# Patient Record
Sex: Female | Born: 1955 | Race: White | Hispanic: No | Marital: Married | Smoking: Never smoker
Health system: Southern US, Community
[De-identification: ages and names within clinical notes are randomized; demographics above are authoritative.]

## PROBLEM LIST (undated history)

## (undated) DIAGNOSIS — Z789 Other specified health status: Secondary | ICD-10-CM

## (undated) DIAGNOSIS — E119 Type 2 diabetes mellitus without complications: Secondary | ICD-10-CM

## (undated) DIAGNOSIS — K219 Gastro-esophageal reflux disease without esophagitis: Secondary | ICD-10-CM

## (undated) DIAGNOSIS — Z8719 Personal history of other diseases of the digestive system: Secondary | ICD-10-CM

## (undated) DIAGNOSIS — K649 Unspecified hemorrhoids: Secondary | ICD-10-CM

## (undated) DIAGNOSIS — M1990 Unspecified osteoarthritis, unspecified site: Secondary | ICD-10-CM

## (undated) HISTORY — PX: TONSILLECTOMY: SUR1361

---

## 2003-08-01 ENCOUNTER — Ambulatory Visit (HOSPITAL_COMMUNITY): Admission: RE | Admit: 2003-08-01 | Discharge: 2003-08-01 | Payer: Self-pay | Admitting: Orthopedic Surgery

## 2004-03-12 ENCOUNTER — Encounter: Admission: RE | Admit: 2004-03-12 | Discharge: 2004-06-10 | Payer: Self-pay | Admitting: *Deleted

## 2004-05-07 ENCOUNTER — Encounter: Admission: RE | Admit: 2004-05-07 | Discharge: 2004-05-07 | Payer: Self-pay | Admitting: *Deleted

## 2004-05-20 ENCOUNTER — Observation Stay (HOSPITAL_COMMUNITY): Admission: RE | Admit: 2004-05-20 | Discharge: 2004-05-21 | Payer: Self-pay | Admitting: *Deleted

## 2004-05-23 ENCOUNTER — Inpatient Hospital Stay (HOSPITAL_COMMUNITY): Admission: AD | Admit: 2004-05-23 | Discharge: 2004-05-26 | Payer: Self-pay | Admitting: Surgery

## 2004-05-23 ENCOUNTER — Encounter: Admission: RE | Admit: 2004-05-23 | Discharge: 2004-05-23 | Payer: Self-pay | Admitting: *Deleted

## 2004-07-15 ENCOUNTER — Encounter: Admission: RE | Admit: 2004-07-15 | Discharge: 2004-10-13 | Payer: Self-pay | Admitting: *Deleted

## 2005-12-28 ENCOUNTER — Emergency Department (HOSPITAL_COMMUNITY): Admission: EM | Admit: 2005-12-28 | Discharge: 2005-12-28 | Payer: Self-pay | Admitting: Emergency Medicine

## 2005-12-29 ENCOUNTER — Emergency Department (HOSPITAL_COMMUNITY): Admission: EM | Admit: 2005-12-29 | Discharge: 2005-12-29 | Payer: Self-pay | Admitting: Emergency Medicine

## 2008-06-14 ENCOUNTER — Encounter: Admission: RE | Admit: 2008-06-14 | Discharge: 2008-06-14 | Payer: Self-pay | Admitting: Nurse Practitioner

## 2009-07-25 ENCOUNTER — Ambulatory Visit (HOSPITAL_COMMUNITY): Admission: RE | Admit: 2009-07-25 | Discharge: 2009-07-25 | Payer: Self-pay | Admitting: General Surgery

## 2009-08-27 ENCOUNTER — Ambulatory Visit (HOSPITAL_COMMUNITY): Admission: RE | Admit: 2009-08-27 | Discharge: 2009-08-28 | Payer: Self-pay | Admitting: General Surgery

## 2010-08-31 LAB — DIFFERENTIAL
Basophils Absolute: 0 10*3/uL (ref 0.0–0.1)
Basophils Absolute: 0.1 10*3/uL (ref 0.0–0.1)
Basophils Relative: 0 % (ref 0–1)
Basophils Relative: 1 % (ref 0–1)
Eosinophils Absolute: 0 10*3/uL (ref 0.0–0.7)
Eosinophils Absolute: 0.2 10*3/uL (ref 0.0–0.7)
Eosinophils Relative: 0 % (ref 0–5)
Eosinophils Relative: 3 % (ref 0–5)
Lymphocytes Relative: 15 % (ref 12–46)
Lymphocytes Relative: 25 % (ref 12–46)
Lymphs Abs: 1.5 10*3/uL (ref 0.7–4.0)
Lymphs Abs: 2 10*3/uL (ref 0.7–4.0)
Monocytes Absolute: 0.4 10*3/uL (ref 0.1–1.0)
Monocytes Absolute: 1 10*3/uL (ref 0.1–1.0)
Monocytes Relative: 10 % (ref 3–12)
Monocytes Relative: 6 % (ref 3–12)
Neutro Abs: 5.1 10*3/uL (ref 1.7–7.7)
Neutro Abs: 7.5 10*3/uL (ref 1.7–7.7)
Neutrophils Relative %: 66 % (ref 43–77)
Neutrophils Relative %: 75 % (ref 43–77)

## 2010-08-31 LAB — GLUCOSE, CAPILLARY
Glucose-Capillary: 107 mg/dL — ABNORMAL HIGH (ref 70–99)
Glucose-Capillary: 149 mg/dL — ABNORMAL HIGH (ref 70–99)
Glucose-Capillary: 176 mg/dL — ABNORMAL HIGH (ref 70–99)
Glucose-Capillary: 188 mg/dL — ABNORMAL HIGH (ref 70–99)
Glucose-Capillary: 200 mg/dL — ABNORMAL HIGH (ref 70–99)
Glucose-Capillary: 231 mg/dL — ABNORMAL HIGH (ref 70–99)
Glucose-Capillary: 92 mg/dL (ref 70–99)

## 2010-08-31 LAB — COMPREHENSIVE METABOLIC PANEL
ALT: 17 U/L (ref 0–35)
AST: 17 U/L (ref 0–37)
Albumin: 4 g/dL (ref 3.5–5.2)
Alkaline Phosphatase: 71 U/L (ref 39–117)
BUN: 6 mg/dL (ref 6–23)
CO2: 30 mEq/L (ref 19–32)
Calcium: 9.6 mg/dL (ref 8.4–10.5)
Chloride: 106 mEq/L (ref 96–112)
Creatinine, Ser: 0.78 mg/dL (ref 0.4–1.2)
GFR calc Af Amer: >60 mL/min (ref >60)
GFR calc non Af Amer: >60 mL/min (ref >60)
Glucose, Bld: 160 mg/dL — ABNORMAL HIGH (ref 70–99)
Potassium: 3.9 mEq/L (ref 3.5–5.1)
Sodium: 142 mEq/L (ref 135–145)
Total Bilirubin: 0.6 mg/dL (ref 0.3–1.2)
Total Protein: 7.4 g/dL (ref 6.0–8.3)

## 2010-08-31 LAB — CBC
HCT: 35.3 % — ABNORMAL LOW (ref 36.0–46.0)
HCT: 40.9 % (ref 36.0–46.0)
Hemoglobin: 11.9 g/dL — ABNORMAL LOW (ref 12.0–15.0)
Hemoglobin: 13.8 g/dL (ref 12.0–15.0)
MCHC: 33.7 g/dL (ref 30.0–36.0)
MCHC: 33.8 g/dL (ref 30.0–36.0)
MCV: 86.9 fL (ref 78.0–100.0)
MCV: 87.2 fL (ref 78.0–100.0)
Platelets: 255 10*3/uL (ref 150–400)
Platelets: 279 10*3/uL (ref 150–400)
RBC: 4.06 MIL/uL (ref 3.87–5.11)
RBC: 4.69 MIL/uL (ref 3.87–5.11)
RDW: 12.5 % (ref 11.5–15.5)
RDW: 12.5 % (ref 11.5–15.5)
WBC: 10 10*3/uL (ref 4.0–10.5)
WBC: 7.8 10*3/uL (ref 4.0–10.5)

## 2010-08-31 LAB — URINALYSIS, ROUTINE W REFLEX MICROSCOPIC
Bilirubin Urine: NEGATIVE
Glucose, UA: NEGATIVE mg/dL
Hgb urine dipstick: NEGATIVE
Ketones, ur: NEGATIVE mg/dL
Nitrite: NEGATIVE
Protein, ur: NEGATIVE mg/dL
Specific Gravity, Urine: 1.016 (ref 1.005–1.030)
Urobilinogen, UA: 0.2 mg/dL (ref 0.0–1.0)
pH: 7.5 (ref 5.0–8.0)

## 2010-10-24 NOTE — Op Note (Signed)
NAMECECILEY, Cassandra Ortega                 ACCOUNT NO.:  [id number]   MEDICAL RECORD NO.:  [id number]          PATIENT TYPE:  INP   LOCATION:  0457                         FACILITY:  [facility]   PHYSICIAN:  Vikki Ports, MDDATE OF BIRTH:  1955/09/25   DATE OF PROCEDURE:  05/21/2004  DATE OF DISCHARGE:                                 OPERATIVE REPORT   PREOPERATIVE DIAGNOSIS:  Morbid obesity.   POSTOPERATIVE DIAGNOSIS:  Morbid obesity.   PROCEDURE:  Laparoscopic adjustable gastric banding.   SURGEON:  Vikki Ports, MD   ASSISTANT:  Thornton Park. Daphine Deutscher, MD   ANESTHESIA:  General.   DESCRIPTION OF PROCEDURE:  Patient was taken to the operating room and  placed in the supine position.  After adequate general anesthesia was  induced using a endotracheal tube, the abdomen was prepped and draped in the  normal sterile fashion.  Using an 11 mm trocar in the Optiview technique,  peritoneal access was obtained.  Pneumoperitoneum was obtained.  Under  direct visualization, an additional 12 mm trocar was placed in the right mid  abdomen, an 11 mm trocar was placed through the falciform ligament.  A 5 mm  trocar was placed in the right abdomen.  A Nathanson retractor was placed  through a 5 mm trocar site in the upper abdomen.  The left lateral segment  was retracted.  The angle of His was dissected using Bovie electrocautery  and blunt dissection.  Then opened the lesser omentum.  Identified an area  on the right crus where the fat was crossing and passed the lap band passer  behind the esophagus and stomach and brought it out at the previously  dissected portion at the angle of His.  The band was in place through the 12  mm port using the [place] lap band passer and was passed behind the esophagus.  At this point, the dilating tube and balloon were placed intraorally by  anesthesia, and the band was closed around the upper part of the stomach.  The band moved easily, and the tube was  removed.  The band was fixed in  place with interrupted 2-0 Ethibond sutures approximating seromuscular bites  from the anterior surface of the stomach to the proximal stomach and  esophagus.  The tubing was then brought out through the 12 mm port site and  the right abdomen.  Pneumoperitoneum was released.  Nathanson retractor was  removed.  The port was fixed to the anterior abdominal fascia using  interrupted Prolene sutures.  The skin over that site was closed with a  subcutaneous 3-0 Vicryl and a subcuticular 4-0 Monocryl.  Steri-Strips and  sterile dressings were applied.  Other incisions were closed with staples.  Patient tolerated the procedure well and went to the PACU in good condition.     Gaylyn Rong   KRH/MEDQ  D:  05/21/2004  T:  05/21/2004  Job:  [id number]

## 2010-10-24 NOTE — H&P (Signed)
NAMELAYLA, KESLING                 ACCOUNT NO.:  [id number]   MEDICAL RECORD NO.:  [id number]          PATIENT TYPE:  INP   LOCATION:  0483                         FACILITY[facility]   PHYSICIAN:  Vikki Ports, MDDATE OF BIRTH:  10-20-1955   DATE OF ADMISSION:  05/23/2004  DATE OF DISCHARGE:  05/26/2004                                HISTORY & PHYSICAL   ADMISSION DIAGNOSIS:  Morbid obesity.   HISTORY OF PRESENT ILLNESS:  Patient is a 55 year old female with a long-  term history of morbid obesity.  She has undergone extensive work-up for  laparoscopic adjustable gastric banding.  She presents now for this  procedure.   PAST MEDICAL HISTORY:  Osteoarthritis.   Dictation stopped at this point.      KRH/MEDQ  D:  06/26/2004  T:  06/26/2004  Job:  [id number]

## 2010-10-24 NOTE — Discharge Summary (Signed)
[name], Cassandra Ortega                 ACCOUNT NO.:  [id number]   MEDICAL RECORD NO.:  [id number]          PATIENT TYPE:  INP   LOCATION:  0483                         FACILITY:  [facility]   PHYSICIAN:  Vikki Ports, MDDATE OF BIRTH:  01/10/1956   DATE OF ADMISSION:  05/23/2004  DATE OF DISCHARGE:  05/26/2004                                 DISCHARGE SUMMARY   ADMISSION DIAGNOSIS:  Morbid obesity.   DISCHARGE DIAGNOSIS:  Morbid obesity.   PROCEDURE:  Laparoscopic adjustable gastric banding.   SURGEON:  Vikki Ports, MD   HOSPITAL COURSE:  The patient was admitted and underwent laparoscopic  adjustable gastric banding.  On the evening of surgery, the patient  developed substernal chest pain.  On her Gastrografin swallow, it showed no  drainage around the band; however, by hospital day #2, she was feeling  better.  We gave her liquids, which she tolerated very well and cleared  quickly, chest pain resolved and she was ready for discharge home on  postoperative day #2, tolerating liquids.   FOLLOWUP:  Followup is with me in 1 week.   DIET:  Clear liquids.   PAIN MEDICINE:  Roxicet elixir.   CONDITION ON DISCHARGE:  Stable.   DISPOSITION:  Discharged to home.      KRH/MEDQ  D:  07/15/2004  T:  07/16/2004  Job:  [id number]

## 2011-03-05 ENCOUNTER — Telehealth (INDEPENDENT_AMBULATORY_CARE_PROVIDER_SITE_OTHER): Payer: Self-pay | Admitting: General Surgery

## 2011-03-05 NOTE — Telephone Encounter (Signed)
03/05/11 Recall letter mailed to patient for bariatric surgery follow-up.Marland KitchenMarland Kitchencef

## 2011-04-10 ENCOUNTER — Ambulatory Visit (INDEPENDENT_AMBULATORY_CARE_PROVIDER_SITE_OTHER): Payer: Self-pay | Admitting: General Surgery

## 2011-11-10 ENCOUNTER — Other Ambulatory Visit: Payer: Self-pay | Admitting: Obstetrics and Gynecology

## 2011-11-10 DIAGNOSIS — R928 Other abnormal and inconclusive findings on diagnostic imaging of breast: Secondary | ICD-10-CM

## 2011-11-17 ENCOUNTER — Ambulatory Visit
Admission: RE | Admit: 2011-11-17 | Discharge: 2011-11-17 | Disposition: A | Discharge status: Home / Self Care | Payer: BC Managed Care – PPO | Source: Ambulatory Visit | Attending: Obstetrics and Gynecology | Admitting: Obstetrics and Gynecology

## 2011-11-17 DIAGNOSIS — R928 Other abnormal and inconclusive findings on diagnostic imaging of breast: Secondary | ICD-10-CM

## 2012-02-26 ENCOUNTER — Telehealth (INDEPENDENT_AMBULATORY_CARE_PROVIDER_SITE_OTHER): Payer: Self-pay | Admitting: General Surgery

## 2012-02-26 NOTE — Telephone Encounter (Signed)
Left message on voicemail stating the patient needs to call [facility] at [phone] to schedule a follow-up appointment....cef °

## 2012-03-09 ENCOUNTER — Other Ambulatory Visit: Payer: Self-pay | Admitting: Orthopedic Surgery

## 2012-03-09 MED ORDER — DEXAMETHASONE SODIUM PHOSPHATE 10 MG/ML IJ SOLN: 10.0000 mg | Freq: Once | INTRAMUSCULAR | Status: DC

## 2012-03-09 NOTE — Progress Notes (Signed)
Preoperative surgical orders have been place into the Epic hospital system for Cassandra Ortega on 03/09/2012, 4:55 PM  by Patrica Duel for surgery on 03/30/12.  Preop Bilateral Total Knee orders including Epidural per Anesthesia, IV Tylenol, and IV Decadron as long as there are no contraindications to the above medications. Avel Peace, PA-C

## 2012-03-17 ENCOUNTER — Encounter (HOSPITAL_COMMUNITY): Payer: Self-pay | Admitting: Pharmacy Technician

## 2012-03-23 NOTE — Patient Instructions (Addendum)
20 Cassandra Ortega  03/23/2012   Your procedure is scheduled on:  10-23 -2013  Report to [facility] at     1130   AM.  Call this number if you have problems the morning of surgery: [phone]  Or Presurgical Testing [phone](Trula Frede)   Remember:   Do not eat food:After Midnight.  May have clear liquids:up to 6 Hours before arrival. Nothing after : 1000 AM  Clear liquids include soda, tea, black coffee, apple or grape juice, broth.  Take these medicines the morning of surgery with A SIP OF WATER: Wellbutrin. Tylenol.   Do not wear jewelry, make-up or nail polish.  Do not wear lotions, powders, or perfumes. You may wear deodorant.  Do not shave 48 hours prior to surgery.(face and neck okay, no shaving of legs)  Do not bring valuables to the hospital.  Contacts, dentures or bridgework may not be worn into surgery.  Leave suitcase in the car. After surgery it may be brought to your room.  For patients admitted to the hospital, checkout time is 11:00 AM the day of discharge.   Patients discharged the day of surgery will not be allowed to drive home. Must have responsible person with you x 24 hours once discharged.  Name and phone number of your driver:   Special Instructions: CHG Shower Use Special Wash: see special instruction sheet.(avoid face and genitals)   Please read over the following fact sheets that you were given: MRSA Information, Blood Transfusion fact sheet, Incentive Spirometry Instruction.

## 2012-03-24 ENCOUNTER — Encounter (HOSPITAL_COMMUNITY)
Admission: RE | Admit: 2012-03-24 | Discharge: 2012-03-24 | Disposition: A | Discharge status: Home / Self Care | Payer: BC Managed Care – PPO | Source: Ambulatory Visit | Attending: Orthopedic Surgery | Admitting: Orthopedic Surgery

## 2012-03-24 ENCOUNTER — Encounter (HOSPITAL_COMMUNITY): Payer: Self-pay

## 2012-03-24 DIAGNOSIS — Z789 Other specified health status: Secondary | ICD-10-CM

## 2012-03-24 DIAGNOSIS — K219 Gastro-esophageal reflux disease without esophagitis: Secondary | ICD-10-CM

## 2012-03-24 DIAGNOSIS — M1990 Unspecified osteoarthritis, unspecified site: Secondary | ICD-10-CM

## 2012-03-24 DIAGNOSIS — E119 Type 2 diabetes mellitus without complications: Secondary | ICD-10-CM

## 2012-03-24 DIAGNOSIS — K649 Unspecified hemorrhoids: Secondary | ICD-10-CM

## 2012-03-24 HISTORY — DX: Unspecified osteoarthritis, unspecified site: M19.90

## 2012-03-24 HISTORY — PX: HERNIA REPAIR: SHX51

## 2012-03-24 HISTORY — DX: Other specified health status: Z78.9

## 2012-03-24 HISTORY — DX: Unspecified hemorrhoids: K64.9

## 2012-03-24 HISTORY — PX: LAPAROSCOPIC GASTRIC BANDING: SHX1100

## 2012-03-24 HISTORY — DX: Gastro-esophageal reflux disease without esophagitis: K21.9

## 2012-03-24 HISTORY — PX: LASIK: SHX215

## 2012-03-24 HISTORY — DX: Personal history of other diseases of the digestive system: Z87.19

## 2012-03-24 HISTORY — DX: Type 2 diabetes mellitus without complications: E11.9

## 2012-03-24 LAB — APTT: aPTT: 31 seconds (ref 24–37)

## 2012-03-24 LAB — URINALYSIS, ROUTINE W REFLEX MICROSCOPIC
Bilirubin Urine: NEGATIVE
Glucose, UA: 100 mg/dL — AB
Hgb urine dipstick: NEGATIVE
Specific Gravity, Urine: 1.026 (ref 1.005–1.030)
Urobilinogen, UA: 1 mg/dL (ref 0.0–1.0)

## 2012-03-24 LAB — CBC
HCT: 39.6 % (ref 36.0–46.0)
Hemoglobin: 14.1 g/dL (ref 12.0–15.0)
RDW: 12.3 % (ref 11.5–15.5)
WBC: 12.4 10*3/uL — ABNORMAL HIGH (ref 4.0–10.5)

## 2012-03-24 LAB — PROTIME-INR: Prothrombin Time: 13.1 seconds (ref 11.6–15.2)

## 2012-03-24 LAB — COMPREHENSIVE METABOLIC PANEL
ALT: 14 U/L (ref 0–35)
Albumin: 3.9 g/dL (ref 3.5–5.2)
Alkaline Phosphatase: 84 U/L (ref 39–117)
BUN: 9 mg/dL (ref 6–23)
Chloride: 96 mEq/L (ref 96–112)
Glucose, Bld: 245 mg/dL — ABNORMAL HIGH (ref 70–99)
Potassium: 4 mEq/L (ref 3.5–5.1)
Total Bilirubin: 0.6 mg/dL (ref 0.3–1.2)

## 2012-03-24 NOTE — Progress Notes (Signed)
Lab result viewable in Epic. Note pt is NIDDM, not fasting. Will check CBG on arrival.

## 2012-03-24 NOTE — Pre-Procedure Instructions (Signed)
03-24-12 EKG done.

## 2012-03-29 ENCOUNTER — Other Ambulatory Visit: Payer: Self-pay | Admitting: Orthopedic Surgery

## 2012-03-29 NOTE — H&P (Signed)
Cassandra Ortega  DOB: 07-31-1955 Married / Language: English / Race: White Female  Date of Admission:  03/30/2012  Chief Complaint:  Bilateral Knee Pain  History of Present Illness The patient is a 56 year old female who comes in for a preoperative History and Physical. The patient is scheduled for a bilateral total knee arthroplasty to be performed by Dr. Gus Rankin. Aluisio, MD at [facility] on 03/30/12. The patient is being followed for their bilateral knee pain and osteoarthritis. The patient has not gotten any relief of their symptoms with viscosupplementation. The knees swell and fell hard. She said she had to use ice for several days to get the swelling down. The knees are getting progressively worse. The Visco supplements did help a little bit. She did have a worse feeling after the last shot, but has gotten better. She is at a stage where she is ready to proceed surgery. They have been treated conservatively in the past for the above stated problem and despite conservative measures, they continue to have progressive pain and severe functional limitations and dysfunction. They have failed non-operative management including home exercise, medications, and injections. It is felt that they would benefit from undergoing bilateral total joint replacement. Risks and benefits of the procedure have been discussed with the patient and they elect to proceed with surgery. There are no active contraindications to surgery such as ongoing infection or rapidly progressive neurological disease.   Problem List Osteoarthritis, Knee (715.96)   Allergies Dyes. IVP dye   Family History Hypertension. father Diabetes Mellitus. father   Social History Drug/Alcohol Rehab (Currently). no Exercise. does other Drug/Alcohol Rehab (Previously). no Alcohol use. current drinker; drinks wine; only occasionally per week Current work status. working full time Tobacco use. never  smoker Marital status. married Pain Contract. no Tobacco / smoke exposure. no Children. 2 Number of flights of stairs before winded. greater than 5 Illicit drug use. no Living situation. live with spouse   Medication History MetFORMIN HCl (500MG  Tablet, Oral) Active. BuPROPion HCl ER (SR) (150MG  Tablet ER 12HR, Oral) Active. Vagifem ( Tablet, Vaginal) Active.   Past Surgical History Cesarean Delivery. 1 time Tonsillectomy Lap Band Procedure. Date: 05/2005.   Medical History Diabetes Mellitus, Type II Hiatal Hernia Hemorrhoids Menopause   Review of Systems General:Not Present- Chills, Fever, Night Sweats, Fatigue, Weight Gain, Weight Loss and Memory Loss. Skin:Not Present- Hives, Itching, Rash, Eczema and Lesions. HEENT:Not Present- Tinnitus, Headache, Double Vision, Visual Loss, Hearing Loss and Dentures. Respiratory:Not Present- Shortness of breath with exertion, Shortness of breath at rest, Allergies, Coughing up blood and Chronic Cough. Cardiovascular:Not Present- Chest Pain, Racing/skipping heartbeats, Difficulty Breathing Lying Down, Murmur, Swelling and Palpitations. Gastrointestinal:Not Present- Bloody Stool, Heartburn, Abdominal Pain, Vomiting, Nausea, Constipation, Diarrhea, Difficulty Swallowing, Jaundice and Loss of appetitie. Female Genitourinary:Not Present- Blood in Urine, Urinary frequency, Weak urinary stream, Discharge, Flank Pain, Incontinence, Painful Urination, Urgency, Urinary Retention and Urinating at Night. Musculoskeletal:Present- Muscle Weakness, Muscle Pain, Joint Swelling and Joint Pain. Not Present- Back Pain, Morning Stiffness and Spasms. Neurological:Not Present- Tremor, Dizziness, Blackout spells, Paralysis, Difficulty with balance and Weakness. Psychiatric:Not Present- Insomnia.   Vitals Weight: 163 lb Height: 60 in Body Surface Area: 1.77 m Body Mass Index: 31.83 kg/m Pulse: 84 (Regular) Resp.: 14  (Unlabored) BP: 132/78 (Sitting, Right Arm, Standard)   Physical Exam The physical exam findings are as follows:  Patient is a 56 year old female with bilateral knee pain.  General Mental Status - Alert, cooperative and good  historian. General Appearance- pleasant. Not in acute distress. Orientation- Oriented X3. Build & Nutrition- Well nourished and Well developed.   Head and Neck Head- normocephalic, atraumatic . Neck Global Assessment- supple. no bruit auscultated on the right and no bruit auscultated on the left.   Eye Pupil- Bilateral- Regular and Round. Motion- Bilateral- EOMI.   ENMT upper and lower dentures  Chest and Lung Exam Auscultation: Breath sounds:- clear at anterior chest wall and - clear at posterior chest wall. Adventitious sounds:- No Adventitious sounds.   Cardiovascular Auscultation:Rhythm- Regular rate and rhythm. Heart Sounds- S1 WNL and S2 WNL. Murmurs & Other Heart Sounds:Auscultation of the heart reveals - No Murmurs.   Abdomen Inspection:Contour- Generalized mild distention. Palpation/Percussion:Tenderness- Abdomen is non-tender to palpation. Rigidity (guarding)- Abdomen is soft. Auscultation:Auscultation of the abdomen reveals - Bowel sounds normal.   Female Genitourinary Not done, not pertinent to present illness  Musculoskeletal She is alert and oriented in no apparent distress. Both knees showed no effusion. There was marked crepitus on range of motion in both knees. Range was about 5 to 120 on each side. She is tender medial greater than lateral. No instability noted.  RADIOGRAPHS: Radiographs. She has endstage arthritis of both knees, medial patellofemoral on both sides. Both are at endstage.  Assessment & Plan Osteoarthritis, Knee (715.96) Impression: Bilateral Knee  Note: Patient is for a bilateral total knee replacements by Dr. Lequita Halt.  Plan is to go to rehab.  Signed  electronically by Roberts Gaudy, PA-C

## 2012-03-30 ENCOUNTER — Ambulatory Visit (HOSPITAL_COMMUNITY): Payer: BC Managed Care – PPO | Admitting: Anesthesiology

## 2012-03-30 ENCOUNTER — Encounter (HOSPITAL_COMMUNITY): Payer: Self-pay | Admitting: Certified Registered Nurse Anesthetist

## 2012-03-30 ENCOUNTER — Encounter (HOSPITAL_COMMUNITY): Payer: Self-pay | Admitting: Anesthesiology

## 2012-03-30 ENCOUNTER — Encounter (HOSPITAL_COMMUNITY)
Admission: RE | Disposition: A | Discharge status: Skilled Nursing Facility | Payer: Self-pay | Source: Ambulatory Visit | Attending: Orthopedic Surgery

## 2012-03-30 ENCOUNTER — Inpatient Hospital Stay (HOSPITAL_COMMUNITY)
Admission: RE | Admit: 2012-03-30 | Discharge: 2012-04-05 | DRG: 471 | Disposition: A | Discharge status: Skilled Nursing Facility | Payer: BC Managed Care – PPO | Source: Ambulatory Visit | Attending: Orthopedic Surgery | Admitting: Orthopedic Surgery

## 2012-03-30 ENCOUNTER — Encounter (HOSPITAL_COMMUNITY): Payer: Self-pay | Admitting: *Deleted

## 2012-03-30 DIAGNOSIS — M1710 Unilateral primary osteoarthritis, unspecified knee: Principal | ICD-10-CM | POA: Diagnosis present

## 2012-03-30 DIAGNOSIS — Z01812 Encounter for preprocedural laboratory examination: Secondary | ICD-10-CM

## 2012-03-30 DIAGNOSIS — D62 Acute posthemorrhagic anemia: Secondary | ICD-10-CM | POA: Diagnosis not present

## 2012-03-30 DIAGNOSIS — E871 Hypo-osmolality and hyponatremia: Secondary | ICD-10-CM | POA: Diagnosis not present

## 2012-03-30 DIAGNOSIS — E119 Type 2 diabetes mellitus without complications: Secondary | ICD-10-CM | POA: Diagnosis present

## 2012-03-30 DIAGNOSIS — K219 Gastro-esophageal reflux disease without esophagitis: Secondary | ICD-10-CM | POA: Diagnosis present

## 2012-03-30 DIAGNOSIS — Z96659 Presence of unspecified artificial knee joint: Secondary | ICD-10-CM

## 2012-03-30 DIAGNOSIS — K449 Diaphragmatic hernia without obstruction or gangrene: Secondary | ICD-10-CM | POA: Diagnosis present

## 2012-03-30 DIAGNOSIS — M179 Osteoarthritis of knee, unspecified: Secondary | ICD-10-CM | POA: Diagnosis present

## 2012-03-30 HISTORY — PX: TOTAL KNEE ARTHROPLASTY: SHX125

## 2012-03-30 LAB — GLUCOSE, CAPILLARY: Glucose-Capillary: 153 mg/dL — ABNORMAL HIGH (ref 70–99)

## 2012-03-30 LAB — TYPE AND SCREEN
ABO/RH(D): B POS
Antibody Screen: NEGATIVE

## 2012-03-30 SURGERY — ARTHROPLASTY, KNEE, BILATERAL, TOTAL
Anesthesia: Epidural | Site: Knee | Laterality: Bilateral | Wound class: Clean

## 2012-03-30 MED ORDER — DOCUSATE SODIUM 100 MG PO CAPS
100.0000 mg | ORAL_CAPSULE | Freq: Two times a day (BID) | ORAL | Status: DC
  Administered 2012-03-31 – 2012-04-05 (×10): 100 mg via ORAL

## 2012-03-30 MED ORDER — HYDROMORPHONE HCL PF 1 MG/ML IJ SOLN
0.2500 mg | INTRAMUSCULAR | Status: DC | PRN
  Administered 2012-03-30 (×4): 0.5 mg via INTRAVENOUS

## 2012-03-30 MED ORDER — ONDANSETRON HCL 4 MG/2ML IJ SOLN
INTRAMUSCULAR | Status: DC | PRN
  Administered 2012-03-30: 4 mg via INTRAVENOUS

## 2012-03-30 MED ORDER — ACETAMINOPHEN 10 MG/ML IV SOLN: 1000.0000 mg | Freq: Once | INTRAVENOUS | Status: DC

## 2012-03-30 MED ORDER — PROPOFOL 10 MG/ML IV BOLUS
INTRAVENOUS | Status: DC | PRN
  Administered 2012-03-30: 150 mg via INTRAVENOUS

## 2012-03-30 MED ORDER — SODIUM CHLORIDE 0.9 % IV SOLN
12.0000 mL/h | INTRAVENOUS | Status: DC
  Administered 2012-03-31 (×3): 12 mL/h via EPIDURAL
  Filled 2012-03-30 (×9): qty 17

## 2012-03-30 MED ORDER — FENTANYL CITRATE 0.05 MG/ML IJ SOLN
INTRAMUSCULAR | Status: AC
  Filled 2012-03-30: qty 2

## 2012-03-30 MED ORDER — MORPHINE SULFATE (PF) 1 MG/ML IV SOLN
INTRAVENOUS | Status: DC
  Administered 2012-03-30: 2 mg via INTRAVENOUS
  Administered 2012-03-30 – 2012-03-31 (×2): 1 mg via INTRAVENOUS
  Administered 2012-04-01 (×2): 4 mg via INTRAVENOUS

## 2012-03-30 MED ORDER — METOCLOPRAMIDE HCL 5 MG/ML IJ SOLN
5.0000 mg | Freq: Three times a day (TID) | INTRAMUSCULAR | Status: DC | PRN
  Administered 2012-03-30 – 2012-03-31 (×2): 10 mg via INTRAVENOUS
  Filled 2012-03-30 (×2): qty 2

## 2012-03-30 MED ORDER — MIDAZOLAM HCL 10 MG/2ML IJ SOLN
1.0000 mg | INTRAMUSCULAR | Status: DC | PRN
  Administered 2012-03-30: 1 mg via INTRAVENOUS

## 2012-03-30 MED ORDER — CISATRACURIUM BESYLATE (PF) 10 MG/5ML IV SOLN
INTRAVENOUS | Status: DC | PRN
  Administered 2012-03-30: 10 mg via INTRAVENOUS

## 2012-03-30 MED ORDER — MORPHINE SULFATE (PF) 1 MG/ML IV SOLN
INTRAVENOUS | Status: AC
  Filled 2012-03-30: qty 25

## 2012-03-30 MED ORDER — FENTANYL CITRATE 0.05 MG/ML IJ SOLN: 50.0000 ug | Freq: Once | INTRAMUSCULAR | Status: DC

## 2012-03-30 MED ORDER — DIPHENHYDRAMINE HCL 50 MG/ML IJ SOLN: 12.5000 mg | Freq: Four times a day (QID) | INTRAMUSCULAR | Status: DC | PRN

## 2012-03-30 MED ORDER — CEFAZOLIN SODIUM 1-5 GM-% IV SOLN
1.0000 g | Freq: Four times a day (QID) | INTRAVENOUS | Status: AC
  Administered 2012-03-30 – 2012-03-31 (×2): 1 g via INTRAVENOUS
  Filled 2012-03-30 (×2): qty 50

## 2012-03-30 MED ORDER — DIPHENHYDRAMINE HCL 12.5 MG/5ML PO ELIX: 12.5000 mg | ORAL_SOLUTION | ORAL | Status: DC | PRN

## 2012-03-30 MED ORDER — BUPIVACAINE ON-Q PAIN PUMP (FOR ORDER SET NO CHG)
INJECTION | Status: DC
  Filled 2012-03-30: qty 1

## 2012-03-30 MED ORDER — SODIUM CHLORIDE 0.9 % IR SOLN
Status: DC | PRN
  Administered 2012-03-30: 1000 mL

## 2012-03-30 MED ORDER — METFORMIN HCL 500 MG PO TABS
500.0000 mg | ORAL_TABLET | Freq: Two times a day (BID) | ORAL | Status: DC
  Filled 2012-03-30 (×4): qty 1

## 2012-03-30 MED ORDER — PHENOL 1.4 % MT LIQD: 1.0000 | OROMUCOSAL | Status: DC | PRN

## 2012-03-30 MED ORDER — CEFAZOLIN SODIUM-DEXTROSE 2-3 GM-% IV SOLR
2.0000 g | INTRAVENOUS | Status: AC
  Administered 2012-03-30: 2 g via INTRAVENOUS

## 2012-03-30 MED ORDER — LACTATED RINGERS IV SOLN
INTRAVENOUS | Status: DC
  Administered 2012-03-30: 1000 mL via INTRAVENOUS
  Administered 2012-03-30 (×2): via INTRAVENOUS

## 2012-03-30 MED ORDER — BUPROPION HCL ER (SR) 150 MG PO TB12
150.0000 mg | ORAL_TABLET | Freq: Every morning | ORAL | Status: DC
  Administered 2012-04-01 – 2012-04-05 (×5): 150 mg via ORAL
  Filled 2012-03-30 (×6): qty 1

## 2012-03-30 MED ORDER — CHLORHEXIDINE GLUCONATE 4 % EX LIQD
60.0000 mL | Freq: Once | CUTANEOUS | Status: DC
  Filled 2012-03-30: qty 60

## 2012-03-30 MED ORDER — SUFENTANIL CITRATE 50 MCG/ML IV SOLN
INTRAVENOUS | Status: DC | PRN
  Administered 2012-03-30 (×3): 10 ug via INTRAVENOUS
  Administered 2012-03-30: 20 ug via INTRAVENOUS

## 2012-03-30 MED ORDER — SODIUM CHLORIDE 0.9 % IV SOLN: INTRAVENOUS | Status: DC

## 2012-03-30 MED ORDER — OXYCODONE HCL 5 MG PO TABS: 5.0000 mg | ORAL_TABLET | Freq: Once | ORAL | Status: DC | PRN

## 2012-03-30 MED ORDER — MIDAZOLAM HCL 2 MG/2ML IJ SOLN
INTRAMUSCULAR | Status: AC
  Filled 2012-03-30: qty 2

## 2012-03-30 MED ORDER — ACETAMINOPHEN 325 MG PO TABS
650.0000 mg | ORAL_TABLET | Freq: Four times a day (QID) | ORAL | Status: DC | PRN
  Administered 2012-04-02: 650 mg via ORAL
  Filled 2012-03-30: qty 2

## 2012-03-30 MED ORDER — KETAMINE HCL 10 MG/ML IJ SOLN
INTRAMUSCULAR | Status: DC | PRN
  Administered 2012-03-30: 25 mg via INTRAVENOUS

## 2012-03-30 MED ORDER — ACETAMINOPHEN 650 MG RE SUPP: 650.0000 mg | Freq: Four times a day (QID) | RECTAL | Status: DC | PRN

## 2012-03-30 MED ORDER — OXYCODONE HCL 5 MG/5ML PO SOLN: 5.0000 mg | Freq: Once | ORAL | Status: DC | PRN

## 2012-03-30 MED ORDER — ACETAMINOPHEN 10 MG/ML IV SOLN: 1000.0000 mg | Freq: Once | INTRAVENOUS | Status: DC | PRN

## 2012-03-30 MED ORDER — FLEET ENEMA 7-19 GM/118ML RE ENEM: 1.0000 | ENEMA | Freq: Once | RECTAL | Status: AC | PRN

## 2012-03-30 MED ORDER — METOCLOPRAMIDE HCL 10 MG PO TABS
5.0000 mg | ORAL_TABLET | Freq: Three times a day (TID) | ORAL | Status: DC | PRN
  Filled 2012-03-30: qty 2

## 2012-03-30 MED ORDER — BUPIVACAINE HCL (PF) 0.25 % IJ SOLN
INTRAMUSCULAR | Status: AC
  Filled 2012-03-30: qty 30

## 2012-03-30 MED ORDER — ONDANSETRON HCL 4 MG/2ML IJ SOLN
4.0000 mg | Freq: Four times a day (QID) | INTRAMUSCULAR | Status: DC | PRN
  Administered 2012-04-01 (×2): 4 mg via INTRAVENOUS
  Filled 2012-03-30 (×3): qty 2

## 2012-03-30 MED ORDER — PROMETHAZINE HCL 25 MG/ML IJ SOLN: 6.2500 mg | INTRAMUSCULAR | Status: DC | PRN

## 2012-03-30 MED ORDER — LIDOCAINE HCL (CARDIAC) 20 MG/ML IV SOLN
INTRAVENOUS | Status: DC | PRN
  Administered 2012-03-30: 100 mg via INTRAVENOUS

## 2012-03-30 MED ORDER — ACETAMINOPHEN 10 MG/ML IV SOLN
INTRAVENOUS | Status: AC
  Filled 2012-03-30: qty 100

## 2012-03-30 MED ORDER — BUPIVACAINE HCL (PF) 0.25 % IJ SOLN
INTRAMUSCULAR | Status: DC | PRN
  Administered 2012-03-30: 2.5 mL
  Administered 2012-03-30: 7.5 mL

## 2012-03-30 MED ORDER — BISACODYL 10 MG RE SUPP
10.0000 mg | Freq: Every day | RECTAL | Status: DC | PRN
  Administered 2012-04-05: 10 mg via RECTAL
  Filled 2012-03-30: qty 1

## 2012-03-30 MED ORDER — MENTHOL 3 MG MT LOZG: 1.0000 | LOZENGE | OROMUCOSAL | Status: DC | PRN

## 2012-03-30 MED ORDER — OXYCODONE HCL 5 MG PO TABS
5.0000 mg | ORAL_TABLET | ORAL | Status: DC | PRN
  Administered 2012-03-31: 5 mg via ORAL
  Administered 2012-04-01: 10 mg via ORAL
  Filled 2012-03-30: qty 2
  Filled 2012-03-30: qty 1

## 2012-03-30 MED ORDER — METHOCARBAMOL 100 MG/ML IJ SOLN
500.0000 mg | Freq: Four times a day (QID) | INTRAVENOUS | Status: DC | PRN
  Administered 2012-03-30: 500 mg via INTRAVENOUS
  Filled 2012-03-30: qty 5

## 2012-03-30 MED ORDER — POTASSIUM CHLORIDE IN NACL 20-0.9 MEQ/L-% IV SOLN
INTRAVENOUS | Status: DC
  Administered 2012-03-30 – 2012-04-01 (×3): via INTRAVENOUS
  Filled 2012-03-30 (×5): qty 1000

## 2012-03-30 MED ORDER — SODIUM CHLORIDE 0.9 % IJ SOLN: 9.0000 mL | INTRAMUSCULAR | Status: DC | PRN

## 2012-03-30 MED ORDER — SODIUM CHLORIDE 0.9 % IV SOLN
8.0000 mL/h | INTRAVENOUS | Status: DC
  Administered 2012-03-30: 12 mL/h via EPIDURAL
  Filled 2012-03-30 (×3): qty 17

## 2012-03-30 MED ORDER — ONDANSETRON HCL 4 MG PO TABS
4.0000 mg | ORAL_TABLET | Freq: Four times a day (QID) | ORAL | Status: DC | PRN
  Filled 2012-03-30: qty 1

## 2012-03-30 MED ORDER — INSULIN ASPART 100 UNIT/ML ~~LOC~~ SOLN
0.0000 [IU] | Freq: Three times a day (TID) | SUBCUTANEOUS | Status: DC
  Administered 2012-03-31 (×2): 5 [IU] via SUBCUTANEOUS
  Administered 2012-03-31: 3 [IU] via SUBCUTANEOUS
  Administered 2012-04-01 (×2): 5 [IU] via SUBCUTANEOUS
  Administered 2012-04-01 – 2012-04-03 (×6): 3 [IU] via SUBCUTANEOUS
  Administered 2012-04-03: 5 [IU] via SUBCUTANEOUS
  Administered 2012-04-04: 3 [IU] via SUBCUTANEOUS
  Administered 2012-04-04: 5 [IU] via SUBCUTANEOUS
  Administered 2012-04-04: 3 [IU] via SUBCUTANEOUS
  Administered 2012-04-05: 5 [IU] via SUBCUTANEOUS
  Administered 2012-04-05: 08:00:00 via SUBCUTANEOUS

## 2012-03-30 MED ORDER — FENTANYL CITRATE 0.05 MG/ML IJ SOLN
50.0000 ug | INTRAMUSCULAR | Status: DC | PRN
  Administered 2012-03-30: 50 ug via INTRAVENOUS

## 2012-03-30 MED ORDER — COSYNTROPIN 0.25 MG IJ SOLR
1.0000 mg | Freq: Once | INTRAMUSCULAR | Status: AC
  Administered 2012-03-30: 1 mg via INTRAVENOUS
  Filled 2012-03-30: qty 1

## 2012-03-30 MED ORDER — DEXAMETHASONE SODIUM PHOSPHATE 10 MG/ML IJ SOLN
INTRAMUSCULAR | Status: DC | PRN
  Administered 2012-03-30: 10 mg via INTRAVENOUS

## 2012-03-30 MED ORDER — POLYETHYLENE GLYCOL 3350 17 G PO PACK
17.0000 g | PACK | Freq: Every day | ORAL | Status: DC | PRN
  Administered 2012-04-03 – 2012-04-05 (×2): 17 g via ORAL

## 2012-03-30 MED ORDER — STERILE WATER FOR IRRIGATION IR SOLN
Status: DC | PRN
  Administered 2012-03-30: 3000 mL

## 2012-03-30 MED ORDER — NALOXONE HCL 0.4 MG/ML IJ SOLN: 0.4000 mg | INTRAMUSCULAR | Status: DC | PRN

## 2012-03-30 MED ORDER — MEPERIDINE HCL 50 MG/ML IJ SOLN: 6.2500 mg | INTRAMUSCULAR | Status: DC | PRN

## 2012-03-30 MED ORDER — CEFAZOLIN SODIUM-DEXTROSE 2-3 GM-% IV SOLR
INTRAVENOUS | Status: AC
  Filled 2012-03-30: qty 50

## 2012-03-30 MED ORDER — HYDROMORPHONE HCL PF 1 MG/ML IJ SOLN
INTRAMUSCULAR | Status: AC
  Filled 2012-03-30: qty 1

## 2012-03-30 MED ORDER — DIPHENHYDRAMINE HCL 12.5 MG/5ML PO ELIX: 12.5000 mg | ORAL_SOLUTION | Freq: Four times a day (QID) | ORAL | Status: DC | PRN

## 2012-03-30 MED ORDER — ONDANSETRON HCL 4 MG/2ML IJ SOLN
4.0000 mg | Freq: Four times a day (QID) | INTRAMUSCULAR | Status: DC | PRN
  Administered 2012-03-31 (×3): 4 mg via INTRAVENOUS
  Filled 2012-03-30 (×2): qty 2

## 2012-03-30 MED ORDER — ACETAMINOPHEN 10 MG/ML IV SOLN
1000.0000 mg | Freq: Four times a day (QID) | INTRAVENOUS | Status: AC
  Administered 2012-03-30 – 2012-03-31 (×4): 1000 mg via INTRAVENOUS
  Filled 2012-03-30 (×6): qty 100

## 2012-03-30 MED ORDER — METHOCARBAMOL 500 MG PO TABS
500.0000 mg | ORAL_TABLET | Freq: Four times a day (QID) | ORAL | Status: DC | PRN
  Administered 2012-03-31 – 2012-04-01 (×2): 500 mg via ORAL
  Filled 2012-03-30 (×2): qty 1

## 2012-03-30 MED ORDER — SODIUM CHLORIDE 0.9 % IV SOLN: 8.0000 mL/h | INTRAVENOUS | Status: DC

## 2012-03-30 MED ORDER — ACETAMINOPHEN 10 MG/ML IV SOLN
INTRAVENOUS | Status: DC | PRN
  Administered 2012-03-30: 1000 mg via INTRAVENOUS

## 2012-03-30 SURGICAL SUPPLY — 58 items
AUTOTRANSFUSION W/QD PVC DRAIN (AUTOTRANSFUSION) ×4 IMPLANT
BAG ZIPLOCK 12X15 (MISCELLANEOUS) IMPLANT
BANDAGE ELASTIC 6 VELCRO ST LF (GAUZE/BANDAGES/DRESSINGS) ×2 IMPLANT
BANDAGE ESMARK 6X9 LF (GAUZE/BANDAGES/DRESSINGS) ×2 IMPLANT
BLADE SAG 18X100X1.27 (BLADE) ×4 IMPLANT
BLADE SAW SGTL 11.0X1.19X90.0M (BLADE) ×4 IMPLANT
BLADE SURG SZ10 CARB STEEL (BLADE) ×4 IMPLANT
BNDG COHESIVE 6X5 TAN STRL LF (GAUZE/BANDAGES/DRESSINGS) ×2 IMPLANT
BNDG ESMARK 6X9 LF (GAUZE/BANDAGES/DRESSINGS) ×4
BOWL SMART MIX CTS (DISPOSABLE) ×4 IMPLANT
CEMENT HV SMART SET (Cement) ×8 IMPLANT
CLOTH BEACON ORANGE TIMEOUT ST (SAFETY) ×2 IMPLANT
CUFF TOURN SGL QUICK 34 (TOURNIQUET CUFF) ×2
CUFF TRNQT CYL 34X4X40X1 (TOURNIQUET CUFF) ×2 IMPLANT
DRAPE EXTREMITY BILATERAL (DRAPE) ×2 IMPLANT
DRAPE INCISE IOBAN 66X45 STRL (DRAPES) ×4 IMPLANT
DRAPE POUCH INSTRU U-SHP 10X18 (DRAPES) ×2 IMPLANT
DRAPE U-SHAPE 47X51 STRL (DRAPES) ×6 IMPLANT
DRSG ADAPTIC 3X8 NADH LF (GAUZE/BANDAGES/DRESSINGS) ×2 IMPLANT
DRSG PAD ABDOMINAL 8X10 ST (GAUZE/BANDAGES/DRESSINGS) ×2 IMPLANT
DURAPREP 26ML APPLICATOR (WOUND CARE) ×4 IMPLANT
ELECT REM PT RETURN 9FT ADLT (ELECTROSURGICAL) ×2
ELECTRODE REM PT RTRN 9FT ADLT (ELECTROSURGICAL) ×1 IMPLANT
EVACUATOR 1/8 PVC DRAIN (DRAIN) IMPLANT
FACESHIELD LNG OPTICON STERILE (SAFETY) ×16 IMPLANT
GLOVE BIO SURGEON STRL SZ7.5 (GLOVE) ×4 IMPLANT
GLOVE BIO SURGEON STRL SZ8 (GLOVE) ×4 IMPLANT
GLOVE BIOGEL PI IND STRL 8 (GLOVE) ×2 IMPLANT
GLOVE BIOGEL PI INDICATOR 8 (GLOVE) ×2
GLOVE ECLIPSE 8.0 STRL XLNG CF (GLOVE) ×4 IMPLANT
GOWN STRL NON-REIN LRG LVL3 (GOWN DISPOSABLE) ×2 IMPLANT
GOWN STRL REIN XL XLG (GOWN DISPOSABLE) ×2 IMPLANT
HANDPIECE INTERPULSE COAX TIP (DISPOSABLE) ×1
IMMOBILIZER KNEE 20 (SOFTGOODS) ×2
IMMOBILIZER KNEE 20 THIGH 36 (SOFTGOODS) ×1 IMPLANT
KIT BASIN OR (CUSTOM PROCEDURE TRAY) ×2 IMPLANT
MANIFOLD NEPTUNE II (INSTRUMENTS) ×2 IMPLANT
NDL SAFETY ECLIPSE 18X1.5 (NEEDLE) ×1 IMPLANT
NEEDLE HYPO 18GX1.5 SHARP (NEEDLE) ×1
NS IRRIG 1000ML POUR BTL (IV SOLUTION) ×2 IMPLANT
PACK TOTAL JOINT (CUSTOM PROCEDURE TRAY) ×2 IMPLANT
PADDING CAST COTTON 6X4 STRL (CAST SUPPLIES) ×2 IMPLANT
SET HNDPC FAN SPRY TIP SCT (DISPOSABLE) ×1 IMPLANT
SPONGE GAUZE 4X4 12PLY (GAUZE/BANDAGES/DRESSINGS) ×2 IMPLANT
SPONGE LAP 18X18 X RAY DECT (DISPOSABLE) ×2 IMPLANT
STOCKINETTE 8 INCH (MISCELLANEOUS) ×2 IMPLANT
STRIP CLOSURE SKIN 1/2X4 (GAUZE/BANDAGES/DRESSINGS) ×8 IMPLANT
SUCTION FRAZIER 12FR DISP (SUCTIONS) ×2 IMPLANT
SUT MNCRL AB 4-0 PS2 18 (SUTURE) ×4 IMPLANT
SUT PDS AB 1 CT1 27 (SUTURE) ×12 IMPLANT
SUT VIC AB 2-0 CT1 27 (SUTURE) ×6
SUT VIC AB 2-0 CT1 TAPERPNT 27 (SUTURE) ×6 IMPLANT
SUT VLOC 180 0 24IN GS25 (SUTURE) ×4 IMPLANT
SYR 50ML LL SCALE MARK (SYRINGE) ×2 IMPLANT
TOWEL OR 17X26 10 PK STRL BLUE (TOWEL DISPOSABLE) ×4 IMPLANT
TRAY FOLEY CATH 14FRSI W/METER (CATHETERS) ×2 IMPLANT
WATER STERILE IRR 1500ML POUR (IV SOLUTION) ×4 IMPLANT
WRAP KNEE MAXI GEL POST OP (GAUZE/BANDAGES/DRESSINGS) ×4 IMPLANT

## 2012-03-30 NOTE — Progress Notes (Signed)
Bolused with 12 mls  Of 1/4%bupivicaine by Dr Rica Mast

## 2012-03-30 NOTE — Interval H&P Note (Signed)
History and Physical Interval Note:  03/30/2012 1:19 PM  Cassandra Ortega  has presented today for surgery, with the diagnosis of osteoarthritis of bilateral knees  The various methods of treatment have been discussed with the patient and family. After consideration of risks, benefits and other options for treatment, the patient has consented to  Procedure(s) (LRB) with comments: TOTAL KNEE BILATERAL (Bilateral) as a surgical intervention .  The patient's history has been reviewed, patient examined, no change in status, stable for surgery.  I have reviewed the patient's chart and labs.  Questions were answered to the patient's satisfaction.     Loanne Drilling

## 2012-03-30 NOTE — Anesthesia Preprocedure Evaluation (Addendum)
Anesthesia Evaluation  Patient identified by MRN, date of birth, ID band Patient awake    Reviewed: Allergy & Precautions, H&P , NPO status , Patient's Chart, lab work & pertinent test results  Airway Mallampati: II TM Distance: >3 FB Neck ROM: Full    Dental  (+) Dental Advisory Given, Partial Upper and Partial Lower   Pulmonary neg pulmonary ROS,  breath sounds clear to auscultation  Pulmonary exam normal       Cardiovascular - Past MI Rhythm:Regular Rate:Normal     Neuro/Psych negative neurological ROS  negative psych ROS   GI/Hepatic Neg liver ROS, hiatal hernia, GERD-  Controlled,  Endo/Other  diabetes, Well Controlled, Type 2, Oral Hypoglycemic Agents  Renal/GU negative Renal ROS     Musculoskeletal  (+) Arthritis -, Osteoarthritis,    Abdominal   Peds  Hematology negative hematology ROS (+)   Anesthesia Other Findings   Reproductive/Obstetrics                          Anesthesia Physical Anesthesia Plan  ASA: II  Anesthesia Plan: General and Epidural   Post-op Pain Management:    Induction: Intravenous  Airway Management Planned: LMA  Additional Equipment:   Intra-op Plan:   Post-operative Plan: Extubation in OR  Informed Consent: I have reviewed the patients History and Physical, chart, labs and discussed the procedure including the risks, benefits and alternatives for the proposed anesthesia with the patient or authorized representative who has indicated his/her understanding and acceptance.   Dental advisory given  Plan Discussed with: CRNA  Anesthesia Plan Comments:         Anesthesia Quick Evaluation

## 2012-03-30 NOTE — Progress Notes (Signed)
Dr fortune at bedside and aware pain unrelieved. Bolused by Dr Rica Mast: 8 mls 1.5% lidocaine with 1;200,000 Epi. Pain easing.

## 2012-03-30 NOTE — Progress Notes (Signed)
Dr Rica Mast notified that pain is relieved, pt dozing , easily arousable, and alert.  Epidural at T6-T8.  Still moving toes on command.  Ordered to decrease rate of Epidural to 43mls/hr and transfer pt to floor.  Dr Rica Mast came to bedside and assessed pt.

## 2012-03-30 NOTE — Preoperative (Signed)
Beta Blockers   Reason not to administer Beta Blockers:Not Applicable 

## 2012-03-30 NOTE — Op Note (Signed)
Pre-operative diagnosis- Osteoarthritis  Bilateral knee(s)  Post-operative diagnosis- Osteoarthritis Bilateral knee(s)  Procedure-  Bilateral  Total Knee Arthroplasty  Surgeon- Cassandra Rankin. Raven Furnas, MD  Assistant- Leilani Able, PA-C   Anesthesia-  General and Epidural EBL-* No blood loss amount entered *  Drains Autovac each side  Tourniquet time-  Total Tourniquet Time Documented: Thigh (Left) - 35 minutes Thigh (Right) - 33 minutes   Complications- None  Condition-PACU - hemodynamically stable.   Brief Clinical Note  Cassandra Ortega is a 56 y.o. 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 She has constant pain, with activity and at rest and significant functional deficits with difficulties even with ADLs. She has had extensive non-op management including analgesics, injections of cortisone and home exercise program, but remains in significant pain with significant dysfunction. Radiographs show bone on bone arthritis medial and patellofemoral on left and bone on bone medial on right. She is s/p patellectomy on the right. She was given the option of staged vs. Simultaneous Total knee arthroplasty and chose to do both in one setting after discussing the procedure, risks potential complications and rehab course associated with both options.She presents now for bilateralTotal Knee Arthroplasty.    Procedure in detail---   The patient is brought into the operating room and positioned supine on the operating table. After successful administration of  General and Epidural,   a tourniquet is placed high on the  Bilateral thigh(s) and both lower extremities are prepped and draped in the usual sterile fashion. Time out is performed by the operating team and then the left lower extremity is wrapped in Esmarch, knee flexed and the tourniquet inflated to 300 mmHg.       A midline incision is made with a ten blade through the subcutaneous tissue  to the level of the extensor mechanism. A fresh blade is used to make a medial parapatellar arthrotomy. Soft tissue over the proximal medial tibia is subperiosteally elevated to the joint line with a knife and into the semimembranosus bursa with a Cobb elevator. Soft tissue over the proximal lateral tibia is elevated with attention being paid to avoiding the patellar tendon on the tibial tubercle. The patella is everted, knee flexed 90 degrees and the ACL and PCL are removed. Findings are bone on bone medial and patellofemoral with large medial osteophytes.        The drill is used to create a starting hole in the distal femur and the canal is thoroughly irrigated with sterile saline to remove the fatty contents. The 5 degree Left  valgus alignment guide is placed into the femoral canal and the distal femoral cutting block is pinned to remove 10 mm off the distal femur. Resection is made with an oscillating saw.      The tibia is subluxed forward and the menisci are removed. The extramedullary alignment guide is placed referencing proximally at the medial aspect of the tibial tubercle and distally along the second metatarsal axis and tibial crest. The block is pinned to remove 2mm off the more deficient medial  side. Resection is made with an oscillating saw. Size 2is the most appropriate size for the tibia and the proximal tibia is prepared with the modular drill and keel punch for that size.      The femoral sizing guide is placed and size 2 is most appropriate. Rotation is marked off the epicondylar axis and confirmed by creating a rectangular flexion gap at  90 degrees. The size 2 cutting block is pinned in this rotation and the anterior, posterior and chamfer cuts are made with the oscillating saw. The intercondylar block is then placed and that cut is made.      Trial size 2 tibial component, trial size 2 posterior stabilized femur and a 12.5  mm posterior stabilized rotating platform insert trial is  placed. Full extension is achieved with excellent varus/valgus and anterior/posterior balance throughout full range of motion. The patella is everted and thickness measured to be 22  mm. Free hand resection is taken to 12 mm, a 35 template is placed, lug holes are drilled, trial patella is placed, and it tracks normally. Osteophytes are removed off the posterior femur with the trial in place. All trials are removed and the cut bone surfaces prepared with pulsatile lavage. Cement is mixed and once ready for implantation, the size 2 tibial implant, size  2 posterior stabilized femoral component, and the size 35 patella are cemented in place and the patella is held with the clamp. The trial insert is placed and the knee held in full extension. All extruded cement is removed and once the cement is hard the permanent 12.5 mm posterior stabilized rotating platform insert is placed into the tibial tray.      The wound is copiously irrigated with saline solution and the extensor mechanism closed over a autovac drain with #1 V-loc suture. The tourniquet is released for a total tourniquet time of 34  minutes. Flexion against gravity is 140 degrees and the patella tracks normally. Subcutaneous tissue is closed with 2.0 vicryl and subcuticular with running 4.0 Monocryl. A sterile wrap is then placed around the left knee. The  Right lower extremity is then wrapped in Esmarch, knee flexed and the tourniquet inflated to 300 mmHg.       A midline incision is made with a ten blade through the subcutaneous tissue to the level of the extensor mechanism. A fresh blade is used to make a medial parapatellar arthrotomy. Soft tissue over the proximal medial tibia is subperiosteally elevated to the joint line with a knife and into the semimembranosus bursa with a Cobb elevator. Soft tissue over the proximal lateral tibia is elevated with attention being paid to avoiding the patellar tendon on the tibial tubercle. The patella is everted,  knee flexed 90 degrees and the ACL and PCL are removed. Findings are bone on bone medial with large medial osteophytes. She is s/p patellectomy on this side.        The drill is used to create a starting hole in the distal femur and the canal is thoroughly irrigated with sterile saline to remove the fatty contents. The 5 degree Right  valgus alignment guide is placed into the femoral canal and the distal femoral cutting block is pinned to remove 10 mm off the distal femur. Resection is made with an oscillating saw.      The tibia is subluxed forward and the menisci are removed. The extramedullary alignment guide is placed referencing proximally at the medial aspect of the tibial tubercle and distally along the second metatarsal axis and tibial crest. The block is pinned to remove 2mm off the more deficient medial  side. Resection is made with an oscillating saw. Size 2 is the most appropriate size for the tibia. Preparation is not completed yet as this is a fixed bearing tibia due to her previous patellectomy.           The femoral  sizing guide is placed and size 2 is most appropriate. Rotation is marked off the epicondylar axis and confirmed by creating a rectangular flexion gap at 90 degrees. The size 2 cutting block is pinned in this rotation and the anterior, posterior and chamfer cuts are made with the oscillating saw. The intercondylar block is then placed and that cut is made.      Trial size 2 tibial component, trial size 2 posterior stabilized femur and a 10  mm posterior stabilized fixed bearing insert trial is placed. Full extension is achieved with excellent varus/valgus and anterior/posterior balance throughout full range of motion. The tibial tray alignment is marked in extension and the trial insert removed. The tray is then prepared with the modular drill and keel punch. Osteophytes are removed off the posterior femur with the trial in place. All trials are removed and the cut bone surfaces  prepared with pulsatile lavage. Cement is mixed and once ready for implantation, the size 2 tibial implant and size 2 posterior stabilized femoral component are cemented in place. The trial insert is placed and the knee held in full extension. All extruded cement is removed and once the cement is hard the permanent 10 mm posterior stabilized fixed bearing insert is placed into the tibial tray.      The wound is copiously irrigated with saline solution and the extensor mechanism closed over a autovac drain with #1 V-loc suture. The tourniquet is released for a total tourniquet time of 32  minutes. Flexion against gravity is 140 degrees and the patella tracks normally. Subcutaneous tissue is closed with 2.0 vicryl and subcuticular with running 4.0 Monocryl.  The incisions are cleaned and dried and steri-strips and a bulky sterile dressings are applied. The limbs are placed into  knee immobilizers and the patient is awakened and transported to recovery in stable condition.        Please note that a surgical assistant was a medical necessity for this procedure in order to perform it in a safe and expeditious manner. Surgical assistant was necessary to retract the ligaments and vital neurovascular structures to prevent injury to them and also necessary for proper positioning of the limb to allow for anatomic placement of the prosthesis.   Cassandra Rankin Javaris Wigington, MD    03/30/2012, 4:01 PM

## 2012-03-30 NOTE — Transfer of Care (Signed)
Immediate Anesthesia Transfer of Care Note  Patient: Cassandra Ortega  Procedure(s) Performed: Procedure(s) (LRB) with comments: TOTAL KNEE BILATERAL (Bilateral)  Patient Location: PACU  Anesthesia Type: General  Level of Consciousness: awake and alert   Airway & Oxygen Therapy: Patient Spontanous Breathing and Patient connected to face mask oxygen  Post-op Assessment: Report given to PACU RN and Post -op Vital signs reviewed and stable  Post vital signs: Reviewed and stable  Complications: No apparent anesthesia complications

## 2012-03-30 NOTE — Anesthesia Procedure Notes (Addendum)
Epidural Patient location during procedure: pre-op Start time: 03/30/2012 1:32 PM End time: 03/30/2012 1:50 PM  Staffing Anesthesiologist: Lewie Loron R Performed by: anesthesiologist   Preanesthetic Checklist Completed: patient identified, site marked, surgical consent, pre-op evaluation, timeout performed, IV checked, risks and benefits discussed and monitors and equipment checked  Epidural Patient position: sitting Prep: ChloraPrep Patient monitoring: heart rate, cardiac monitor, continuous pulse ox and blood pressure Approach: midline Injection technique: LOR air  Needle:  Needle type: Tuohy  Needle gauge: 17 G Needle length: 9 cm Needle insertion depth: 6 cm Catheter type: closed end flexible Catheter size: 19 Gauge Catheter at skin depth: 12 cm Test dose: negative  Additional Notes Possible wet tap at L4-5. One level up and single pass without issue. Paresthesia with catheter advancement, transient. Reason for block:post-op pain management  Procedure Name: Intubation Date/Time: 03/30/2012 2:14 PM Performed by: Leroy Libman L Patient Re-evaluated:Patient Re-evaluated prior to inductionOxygen Delivery Method: Circle system utilized Preoxygenation: Pre-oxygenation with 100% oxygen Intubation Type: IV induction Ventilation: Mask ventilation without difficulty and Oral airway inserted - appropriate to patient size Laryngoscope Size: Edling and 2 Grade View: Grade I Tube size: 7.5 mm Number of attempts: 1 Airway Equipment and Method: Stylet Placement Confirmation: ETT inserted through vocal cords under direct vision,  breath sounds checked- equal and bilateral and positive ETCO2 Secured at: 21 cm Tube secured with: Tape Dental Injury: Teeth and Oropharynx as per pre-operative assessment

## 2012-03-30 NOTE — H&P (View-Only) (Signed)
Cassandra Ortega  DOB: 11/22/1955 Married / Language: English / Race: White Female  Date of Admission:  03/30/2012  Chief Complaint:  Bilateral Knee Pain  History of Present Illness The patient is a 56 year old female who comes in for a preoperative History and Physical. The patient is scheduled for a bilateral total knee arthroplasty to be performed by Dr. Frank V. Aluisio, MD at [facility] on 03/30/12. The patient is being followed for their bilateral knee pain and osteoarthritis. The patient has not gotten any relief of their symptoms with viscosupplementation. The knees swell and fell hard. She said she had to use ice for several days to get the swelling down. The knees are getting progressively worse. The Visco supplements did help a little bit. She did have a worse feeling after the last shot, but has gotten better. She is at a stage where she is ready to proceed surgery. They have been treated conservatively in the past for the above stated problem and despite conservative measures, they continue to have progressive pain and severe functional limitations and dysfunction. They have failed non-operative management including home exercise, medications, and injections. It is felt that they would benefit from undergoing bilateral total joint replacement. Risks and benefits of the procedure have been discussed with the patient and they elect to proceed with surgery. There are no active contraindications to surgery such as ongoing infection or rapidly progressive neurological disease.   Problem List Osteoarthritis, Knee (715.96)   Allergies Dyes. IVP dye   Family History Hypertension. father Diabetes Mellitus. father   Social History Drug/Alcohol Rehab (Currently). no Exercise. does other Drug/Alcohol Rehab (Previously). no Alcohol use. current drinker; drinks wine; only occasionally per week Current work status. working full time Tobacco use. never  smoker Marital status. married Pain Contract. no Tobacco / smoke exposure. no Children. 2 Number of flights of stairs before winded. greater than 5 Illicit drug use. no Living situation. live with spouse   Medication History MetFORMIN HCl (500MG Tablet, Oral) Active. BuPROPion HCl ER (SR) (150MG Tablet ER 12HR, Oral) Active. Vagifem (10MCG Tablet, Vaginal) Active.   Past Surgical History Cesarean Delivery. 1 time Tonsillectomy Lap Band Procedure. Date: 05/2005.   Medical History Diabetes Mellitus, Type II Hiatal Hernia Hemorrhoids Menopause   Review of Systems General:Not Present- Chills, Fever, Night Sweats, Fatigue, Weight Gain, Weight Loss and Memory Loss. Skin:Not Present- Hives, Itching, Rash, Eczema and Lesions. HEENT:Not Present- Tinnitus, Headache, Double Vision, Visual Loss, Hearing Loss and Dentures. Respiratory:Not Present- Shortness of breath with exertion, Shortness of breath at rest, Allergies, Coughing up blood and Chronic Cough. Cardiovascular:Not Present- Chest Pain, Racing/skipping heartbeats, Difficulty Breathing Lying Down, Murmur, Swelling and Palpitations. Gastrointestinal:Not Present- Bloody Stool, Heartburn, Abdominal Pain, Vomiting, Nausea, Constipation, Diarrhea, Difficulty Swallowing, Jaundice and Loss of appetitie. Female Genitourinary:Not Present- Blood in Urine, Urinary frequency, Weak urinary stream, Discharge, Flank Pain, Incontinence, Painful Urination, Urgency, Urinary Retention and Urinating at Night. Musculoskeletal:Present- Muscle Weakness, Muscle Pain, Joint Swelling and Joint Pain. Not Present- Back Pain, Morning Stiffness and Spasms. Neurological:Not Present- Tremor, Dizziness, Blackout spells, Paralysis, Difficulty with balance and Weakness. Psychiatric:Not Present- Insomnia.   Vitals Weight: 163 lb Height: 60 in Body Surface Area: 1.77 m Body Mass Index: 31.83 kg/m Pulse: 84 (Regular) Resp.: 14  (Unlabored) BP: 132/78 (Sitting, Right Arm, Standard)   Physical Exam The physical exam findings are as follows:  Patient is a 56 year old female with bilateral knee pain.  General Mental Status - Alert, cooperative and good   historian. General Appearance- pleasant. Not in acute distress. Orientation- Oriented X3. Build & Nutrition- Well nourished and Well developed.   Head and Neck Head- normocephalic, atraumatic . Neck Global Assessment- supple. no bruit auscultated on the right and no bruit auscultated on the left.   Eye Pupil- Bilateral- Regular and Round. Motion- Bilateral- EOMI.   ENMT upper and lower dentures  Chest and Lung Exam Auscultation: Breath sounds:- clear at anterior chest wall and - clear at posterior chest wall. Adventitious sounds:- No Adventitious sounds.   Cardiovascular Auscultation:Rhythm- Regular rate and rhythm. Heart Sounds- S1 WNL and S2 WNL. Murmurs & Other Heart Sounds:Auscultation of the heart reveals - No Murmurs.   Abdomen Inspection:Contour- Generalized mild distention. Palpation/Percussion:Tenderness- Abdomen is non-tender to palpation. Rigidity (guarding)- Abdomen is soft. Auscultation:Auscultation of the abdomen reveals - Bowel sounds normal.   Female Genitourinary Not done, not pertinent to present illness  Musculoskeletal She is alert and oriented in no apparent distress. Both knees showed no effusion. There was marked crepitus on range of motion in both knees. Range was about 5 to 120 on each side. She is tender medial greater than lateral. No instability noted.  RADIOGRAPHS: Radiographs. She has endstage arthritis of both knees, medial patellofemoral on both sides. Both are at endstage.  Assessment & Plan Osteoarthritis, Knee (715.96) Impression: Bilateral Knee  Note: Patient is for a bilateral total knee replacements by Dr. Aluisio.  Plan is to go to rehab.  Signed  electronically by DREW L PERKINS, PA-C 

## 2012-03-30 NOTE — Anesthesia Postprocedure Evaluation (Signed)
Anesthesia Post Note  Patient: Cassandra Ortega  Procedure(s) Performed: Procedure(s) (LRB): TOTAL KNEE BILATERAL (Bilateral)  Anesthesia type: General  Patient location: PACU  Post pain: Pain level controlled  Post assessment: Post-op Vital signs reviewed  Last Vitals:  Filed Vitals:   03/30/12 1751  BP: 123/55  Pulse: 76  Temp:   Resp: 14    Post vital signs: Reviewed  Level of consciousness: sedated  Complications: No apparent anesthesia complications

## 2012-03-30 NOTE — Progress Notes (Signed)
Dr Rica Mast at bedside , notified of unrelieved pain.  Bupivicaine increased to 66mls/hr per Dr Rica Mast, received total 2mg  Dilaudid.  Pt resting comfortably now

## 2012-03-31 ENCOUNTER — Encounter (HOSPITAL_COMMUNITY): Payer: Self-pay | Admitting: Orthopedic Surgery

## 2012-03-31 DIAGNOSIS — D62 Acute posthemorrhagic anemia: Secondary | ICD-10-CM | POA: Diagnosis not present

## 2012-03-31 DIAGNOSIS — E871 Hypo-osmolality and hyponatremia: Secondary | ICD-10-CM

## 2012-03-31 LAB — CBC
Hemoglobin: 11.4 g/dL — ABNORMAL LOW (ref 12.0–15.0)
MCH: 28.4 pg (ref 26.0–34.0)
MCV: 79.6 fL (ref 78.0–100.0)
RBC: 4.02 MIL/uL (ref 3.87–5.11)

## 2012-03-31 LAB — GLUCOSE, CAPILLARY
Glucose-Capillary: 250 mg/dL — ABNORMAL HIGH (ref 70–99)
Glucose-Capillary: 228 mg/dL — ABNORMAL HIGH (ref 70–99)
Glucose-Capillary: 189 mg/dL — ABNORMAL HIGH (ref 70–99)
Glucose-Capillary: 228 mg/dL — ABNORMAL HIGH (ref 70–99)

## 2012-03-31 LAB — BASIC METABOLIC PANEL
CO2: 22 mEq/L (ref 19–32)
Calcium: 8.6 mg/dL (ref 8.4–10.5)
Chloride: 98 mEq/L (ref 96–112)
Glucose, Bld: 316 mg/dL — ABNORMAL HIGH (ref 70–99)
Sodium: 133 mEq/L — ABNORMAL LOW (ref 135–145)

## 2012-03-31 LAB — PROTIME-INR: INR: 1.15 (ref 0.00–1.49)

## 2012-03-31 MED ORDER — COUMADIN BOOK
Freq: Once | Status: AC
  Administered 2012-03-31: 1
  Filled 2012-03-31: qty 1

## 2012-03-31 MED ORDER — WARFARIN SODIUM 5 MG PO TABS
5.0000 mg | ORAL_TABLET | Freq: Once | ORAL | Status: AC
  Administered 2012-03-31: 5 mg via ORAL
  Filled 2012-03-31: qty 1

## 2012-03-31 MED ORDER — ONDANSETRON HCL 4 MG/2ML IJ SOLN
4.0000 mg | INTRAMUSCULAR | Status: DC | PRN
  Administered 2012-03-31 – 2012-04-01 (×2): 4 mg via INTRAVENOUS
  Filled 2012-03-31 (×2): qty 2

## 2012-03-31 MED ORDER — POLYSACCHARIDE IRON COMPLEX 150 MG PO CAPS
150.0000 mg | ORAL_CAPSULE | Freq: Every day | ORAL | Status: DC
  Administered 2012-04-02 – 2012-04-05 (×4): 150 mg via ORAL
  Filled 2012-03-31 (×6): qty 1

## 2012-03-31 MED ORDER — WARFARIN - PHARMACIST DOSING INPATIENT: Freq: Every day | Status: DC

## 2012-03-31 MED ORDER — WARFARIN VIDEO
Freq: Once | Status: AC
  Administered 2012-03-31: 18:00:00

## 2012-03-31 NOTE — Addendum Note (Signed)
Addendum  created 03/31/12 1018 by Eilene Ghazi, MD   Modules edited:Inpatient Notes

## 2012-03-31 NOTE — Progress Notes (Signed)
Physical Therapy Treatment Patient Details Name: Cassandra Ortega MRN: [id number] DOB: 11-07-1955 Today's Date: 03/31/2012 Time: 1325-1340 PT Time Calculation (min): 15 min  PT Assessment / Plan / Recommendation Comments on Treatment Session  Pt continues ltd by affects of epidural on LE sensation    Follow Up Recommendations  Post acute inpatient     Does the patient have the potential to tolerate intense rehabilitation  Yes, Recommend IP Rehab Screening  Barriers to Discharge None      Equipment Recommendations  Rolling walker with 5" wheels    Recommendations for Other Services OT consult  Frequency 7X/week   Plan Discharge plan remains appropriate    Precautions / Restrictions Precautions Precautions: Knee;Fall Required Braces or Orthoses: Knee Immobilizer - Right;Knee Immobilizer - Left Knee Immobilizer - Right: Discontinue once straight leg raise with < 10 degree lag Knee Immobilizer - Left: Discontinue once straight leg raise with < 10 degree lag Restrictions Weight Bearing Restrictions: No RLE Weight Bearing: Weight bearing as tolerated LLE Weight Bearing: Weight bearing as tolerated   Pertinent Vitals/Pain 2/10 bil LE    Mobility  Bed Mobility Bed Mobility: Sit to Supine Supine to Sit: 1: +2 Total assist Supine to Sit: Patient Percentage: 40% Sit to Supine: 1: +2 Total assist Sit to Supine: Patient Percentage: 40% Details for Bed Mobility Assistance: cues for sequence and assist with Bil LEs and to bring trunk to upright position Transfers Transfers: Sit to Stand;Stand to Sit Sit to Stand: 1: +2 Total assist;From chair/3-in-1 Sit to Stand: Patient Percentage: 40% Stand to Sit: 1: +2 Total assist;To bed;With upper extremity assist Stand to Sit: Patient Percentage: 30% Stand Pivot Transfers: 1: +2 Total assist Stand Pivot Transfers: Patient Percentage: 40% Details for Transfer Assistance: cues for use of UEs: cues and assist for management of  LEs Ambulation/Gait Ambulation/Gait Assistance:  (Stand pvt with RW only - pt ltd by decreased LE propriocepti)    Exercises Total Joint Exercises Ankle Circles/Pumps: AROM;10 reps;Supine;Both Quad Sets: AROM;AAROM;10 reps;Both;Supine Heel Slides: AAROM;10 reps;Both;Supine Straight Leg Raises: AAROM;Both;Supine;10 reps   PT Diagnosis: Difficulty walking  PT Problem List: Decreased strength;Decreased range of motion;Decreased activity tolerance;Decreased balance;Decreased mobility;Decreased knowledge of use of DME;Pain;Decreased knowledge of precautions PT Treatment Interventions: DME instruction;Gait training;Stair training;Functional mobility training;Therapeutic activities;Therapeutic exercise;Patient/family education   PT Goals Acute Rehab PT Goals PT Goal Formulation: With patient Time For Goal Achievement: 04/06/12 Potential to Achieve Goals: Good Pt will go Supine/Side to Sit: with min assist PT Goal: Supine/Side to Sit - Progress: Goal set today Pt will go Sit to Supine/Side: with min assist PT Goal: Sit to Supine/Side - Progress: Goal set today Pt will go Sit to Stand: with min assist PT Goal: Sit to Stand - Progress: Goal set today Pt will go Stand to Sit: with min assist PT Goal: Stand to Sit - Progress: Goal set today Pt will Ambulate: 16 - 50 feet;with min assist;with rolling walker PT Goal: Ambulate - Progress: Goal set today  Visit Information  Last PT Received On: 03/31/12 Assistance Needed: +2    Subjective Data  Subjective: I'm ready to lay down Patient Stated Goal: Rehab and home to resume previous lifestyle with decreased pain   Cognition  Overall Cognitive Status: Appears within functional limits for tasks assessed/performed Arousal/Alertness: Awake/alert Orientation Level: Appears intact for tasks assessed Behavior During Session: [facility] for tasks performed    Balance     End of Session PT - End of Session Equipment Utilized During Treatment: Gait  belt;Left knee  immobilizer;Right knee immobilizer Activity Tolerance: Patient limited by fatigue;Other (comment) Patient left: in bed;with call bell/phone within reach Nurse Communication: Mobility status;Other (comment) CPM Left Knee CPM Left Knee: On   GP     Cassandra Ortega 03/31/2012, 4:36 PM

## 2012-03-31 NOTE — Plan of Care (Signed)
Problem: Consults Goal: Diagnosis- Total Joint Replacement Outcome: Completed/Met Date Met:  03/31/12 Primary Total Knee BILATERAL  Problem: Phase I Progression Outcomes Goal: Dangle or out of bed evening of surgery Outcome: Not Applicable Date Met:  03/31/12 Bilateral total knees with epidural.
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
## 2012-03-31 NOTE — Progress Notes (Signed)
Rehab Admissions Coordinator Note:  Patient was screened by Cassandra Ortega for appropriateness for an Inpatient Acute Rehab Consult.  Cassandra Ortega will be following pt tomorrow. [phone]  Cassandra Dupes, RN 03/31/2012, 4:52 PM  I can be reached at [phone].

## 2012-03-31 NOTE — Progress Notes (Signed)
ANTICOAGULATION CONSULT NOTE - Initial Consult  Pharmacy Consult for Warfarin Indication: VTE prophylaxis  Allergies  Allergen Reactions  . Ivp Dye (Iodinated Diagnostic Agents) Hives, Swelling and Other (See Comments)    Sneezing     Patient Measurements:  Wt=73.5 kg   Vital Signs: Temp: 98.3 F (36.8 C) (10/23 2206) Temp src: Oral (10/23 1911) BP: 143/71 mmHg (10/23 2206) Pulse Rate: 87  (10/23 2206)  Labs: No results found for this basename: HGB:2,HCT:3,PLT:3,APTT:3,LABPROT:3,INR:3,HEPARINUNFRC:3,CREATININE:3,CKTOTAL:3,CKMB:3,TROPONINI:3 in the last 72 hours  CrCl is unknown because there is no height on file for the current visit.   Medical History: Past Medical History  Diagnosis Date  . Diabetes mellitus without complication 03-24-12    NIDDM x 15 yrs-oral meds  . H/O hiatal hernia   . GERD (gastroesophageal reflux disease) 03-24-12    OTC meds used as needed  . Hemorrhoids 03-24-12    not presently a problem  . Arthritis 03-24-12    osteoarthritis. Rt. knee cap shattered. Left femur and tibia fx-no hardware.  . No pertinent past medical history 03-24-12    "Flu Shot and Whooping Cough,Tetanus" done 03-23-12    Medications:  Scheduled:    . acetaminophen  1,000 mg Intravenous Q6H  . buPROPion  150 mg Oral q morning - 10a  .  ceFAZolin (ANCEF) IV  1 g Intravenous Q6H  .  ceFAZolin (ANCEF) IV  2 g Intravenous 60 min Pre-Op  . cosyntropin  1 mg Intravenous Once  . docusate sodium  100 mg Oral BID  . HYDROmorphone      . HYDROmorphone      . insulin aspart  0-15 Units Subcutaneous TID WC  . metFORMIN  500 mg Oral BID WC  . morphine   Intravenous Q4H  . morphine      . DISCONTD: acetaminophen  1,000 mg Intravenous Once  . DISCONTD: chlorhexidine  60 mL Topical Once  . DISCONTD: fentaNYL  50-100 mcg Intravenous Once   Infusions:    . 0.9 % NaCl with KCl 20 mEq / L 75 mL/hr at 03/30/12 2011  . bupivacaine 1/10 %    . bupivacaine ON-Q pain pump    .  DISCONTD: sodium chloride    . DISCONTD: bupivacaine 1/10 %    . DISCONTD: bupivacaine 1/10 % 14 mL/hr (03/30/12 1703)  . DISCONTD: lactated ringers      Assessment: 56 yo female s/p bilateral TKA, currently has epidural placed.  Goal of Therapy:  INR 2-3    Plan:   Warfarin 5mg  x1 today @ 1800. (F/U epidural removal)  Daily PT/INR  Education.  Susanne Greenhouse R 03/31/2012,12:18 AM

## 2012-03-31 NOTE — Progress Notes (Signed)
Patient afebrile, vital signs stable.  Adequate analgesia. Site clean, dry, and intact. Patients questions answered.  Plan continue current infusion Will discontinue per surgeons request tomorrow  C/o mild nausea
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
## 2012-03-31 NOTE — Care Management Note (Addendum)
    Page 1 of 2   04/05/2012     3:13:29 PM   CARE MANAGEMENT NOTE 04/05/2012  Patient:  Cassandra Ortega, Cassandra Ortega   Account Number:  [id number]  Date Initiated:  03/31/2012  Documentation initiated by:  Colleen Can  Subjective/Objective Assessment:   dx osteoarthritis bilateral knee; bilateraL knee replacemnts on day of admission  Pt is from home. Spouse will be caregiver upon her return to home     Action/Plan:   CM spoke with patient. States [facility] for rehab. States   Anticipated DC Date:  04/03/2012   Anticipated DC Plan:  IP REHAB FACILITY  In-house referral  Clinical Social Worker      DC Planning Services  CM consult      PAC Choice  IP REHAB   Choice offered to / List presented to:  C-1 Patient   DME arranged  NA      DME agency  NA     HH arranged  NA      HH agency  NA   Status of service:  Completed, signed off Medicare Important Message given?  NO (If response is "NO", the following Medicare IM given date fields will be blank) Date Medicare IM given:   Date Additional Medicare IM given:    Discharge Disposition:  SKILLED NURSING FACILITY  Per UR Regulation:  Reviewed for med. necessity/level of care/duration of stay  If discussed at Long Length of Stay Meetings, dates discussed:    Comments:  10/27 12n debbie dowell rn,bsn received call from [facility] that if pt goes home they will follow. phy ther rec cir and rehab md consult was ordered.

## 2012-03-31 NOTE — Evaluation (Signed)
Physical Therapy Evaluation Patient Details Name: Cassandra Ortega MRN: [id number] DOB: [birth year] Today's Date: 03/31/2012 Time: 4098-1191 PT Time Calculation (min): 32 min  PT Assessment / Plan / Recommendation Clinical Impression  Pt s/p Bil TKR presents with decreased BIL LE strength/ROM severely limiting functional mobility    PT Assessment  Patient needs continued PT services    Follow Up Recommendations  Post acute inpatient    Does the patient have the potential to tolerate intense rehabilitation   Yes, Recommend IP Rehab Screening  Barriers to Discharge None      Equipment Recommendations  Rolling walker with 5" wheels    Recommendations for Other Services OT consult   Frequency 7X/week    Precautions / Restrictions Precautions Precautions: Knee;Fall Required Braces or Orthoses: Knee Immobilizer - Right;Knee Immobilizer - Left Knee Immobilizer - Right: Discontinue once straight leg raise with < 10 degree lag Knee Immobilizer - Left: Discontinue once straight leg raise with < 10 degree lag Restrictions Weight Bearing Restrictions: No RLE Weight Bearing: Weight bearing as tolerated LLE Weight Bearing: Weight bearing as tolerated   Pertinent Vitals/Pain 2/10; epidural in place, cold packs provided      Mobility  Bed Mobility Bed Mobility: Supine to Sit Supine to Sit: 1: +2 Total assist Supine to Sit: Patient Percentage: 40% Details for Bed Mobility Assistance: cues for sequence and assist with Bil LEs and to bring trunk to upright position Transfers Transfers: Sit to Stand;Stand to Sit Sit to Stand: 1: +2 Total assist;From elevated surface;From bed Sit to Stand: Patient Percentage: 40% Stand to Sit: 1: +2 Total assist;To chair/3-in-1;With armrests;With upper extremity assist Stand to Sit: Patient Percentage: 30% Stand Pivot Transfers: 1: +2 Total assist Stand Pivot Transfers: Patient Percentage: 30% Details for Transfer Assistance: cues for use of UEs: cues  and assist for management of LEs Ambulation/Gait Ambulation/Gait Assistance:  (Stand pvt with RW only - pt ltd by decreased LE propriocepti)    Shoulder Instructions     Exercises Total Joint Exercises Ankle Circles/Pumps: AROM;10 reps;Supine;Both Quad Sets: AROM;AAROM;10 reps;Both;Supine Heel Slides: AAROM;10 reps;Both;Supine Straight Leg Raises: AAROM;Both;Supine;10 reps   PT Diagnosis: Difficulty walking  PT Problem List: Decreased strength;Decreased range of motion;Decreased activity tolerance;Decreased balance;Decreased mobility;Decreased knowledge of use of DME;Pain;Decreased knowledge of precautions PT Treatment Interventions: DME instruction;Gait training;Stair training;Functional mobility training;Therapeutic activities;Therapeutic exercise;Patient/family education   PT Goals Acute Rehab PT Goals PT Goal Formulation: With patient Time For Goal Achievement: 04/06/12 Potential to Achieve Goals: Good Pt will go Supine/Side to Sit: with min assist PT Goal: Supine/Side to Sit - Progress: Goal set today Pt will go Sit to Supine/Side: with min assist PT Goal: Sit to Supine/Side - Progress: Goal set today Pt will go Sit to Stand: with min assist PT Goal: Sit to Stand - Progress: Goal set today Pt will go Stand to Sit: with min assist PT Goal: Stand to Sit - Progress: Goal set today Pt will Ambulate: 16 - 50 feet;with min assist;with rolling walker PT Goal: Ambulate - Progress: Goal set today  Visit Information  Last PT Received On: 03/31/12 Assistance Needed: +2    Subjective Data  Subjective: I'm ready to get up Patient Stated Goal: Rehab and home to resume previous lifestyle with decreased pain   Prior Functioning  Home Living Lives With: Spouse Prior Function Level of Independence: Independent Able to Take Stairs?: Yes Driving: Yes Communication Communication: No difficulties    Cognition  Overall Cognitive Status: Appears within functional limits for tasks  assessed/performed Arousal/Alertness: Awake/alert  Orientation Level: Appears intact for tasks assessed Behavior During Session: [place] for tasks performed    Extremity/Trunk Assessment Right Upper Extremity Assessment RUE ROM/Strength/Tone: [facility] for tasks assessed Left Upper Extremity Assessment LUE ROM/Strength/Tone: WFL for tasks assessed Right Lower Extremity Assessment RLE ROM/Strength/Tone: Deficits RLE ROM/Strength/Tone Deficits: AAROM at knee -10 - 60 with 2+/5 quads Left Lower Extremity Assessment LLE ROM/Strength/Tone: Deficits LLE ROM/Strength/Tone Deficits: AAROM at knee -10 - 50 with min quads strength   Balance    End of Session PT - End of Session Equipment Utilized During Treatment: Gait belt;Left knee immobilizer;Right knee immobilizer Activity Tolerance: Patient limited by fatigue;Other (comment) Patient left: in chair;with call bell/phone within reach;with family/visitor present Nurse Communication: Mobility status;Other (comment) (momentary unresponsiveness after sitting in chair) CPM Left Knee CPM Left Knee: On  GP     Council Munguia 03/31/2012, 4:29 PM

## 2012-03-31 NOTE — Progress Notes (Signed)
Utilization review completed.  

## 2012-03-31 NOTE — Progress Notes (Signed)
Clinical Social Work Department BRIEF PSYCHOSOCIAL ASSESSMENT 03/31/2012  Patient:  Cassandra Ortega, Cassandra Ortega     Account Number:  [id number]     Admit date:  03/30/2012  Clinical Social Worker:  Candie Chroman  Date/Time:  03/31/2012 03:59 PM  Referred by:  Physician  Date Referred:  03/31/2012 Referred for  SNF Placement   Other Referral:   Interview type:  Patient Other interview type:    PSYCHOSOCIAL DATA Living Status:  HUSBAND Admitted from facility:   Level of care:   Primary support name:  Bethann Punches Primary support relationship to patient:  SPOUSE Degree of support available:   unclear    CURRENT CONCERNS Current Concerns  Post-Acute Placement   Other Concerns:    SOCIAL WORK ASSESSMENT / PLAN Pt is a 56 yr old female living at home prior to hospitalization. CSW met with pt to assist with d/c planning needs. Rehab is needed following hospital d/c. Pt is interested in [facility] but is willing to accept ST SN[facility] placement at [facility] if BCBS will not approve acute rehab. [facility] contacted and will assist if needed. CSW will follow to assist with d/c planning needs.   Assessment/plan status:  Psychosocial Support/Ongoing Assessment of Needs Other assessment/ plan:   CIR vs SNF   Information/referral to community resources:   None needed at this time.    PATIENT'S/FAMILY'S RESPONSE TO PLAN OF CARE: Pt would like acute rehab at [facility] if BCBS will approve. CSW will assist with SNF placement if needed.   Cori Razor LCSW [phone]

## 2012-04-01 LAB — BASIC METABOLIC PANEL
BUN: 7 mg/dL (ref 6–23)
Chloride: 99 mEq/L (ref 96–112)
GFR calc Af Amer: >90 mL/min (ref >90)
Glucose, Bld: 254 mg/dL — ABNORMAL HIGH (ref 70–99)
Potassium: 3.8 mEq/L (ref 3.5–5.1)

## 2012-04-01 LAB — CBC
HCT: 27.4 % — ABNORMAL LOW (ref 36.0–46.0)
Hemoglobin: 9.7 g/dL — ABNORMAL LOW (ref 12.0–15.0)
RDW: 13 % (ref 11.5–15.5)
WBC: 8.6 10*3/uL (ref 4.0–10.5)

## 2012-04-01 LAB — GLUCOSE, CAPILLARY: Glucose-Capillary: 205 mg/dL — ABNORMAL HIGH (ref 70–99)

## 2012-04-01 LAB — PROTIME-INR: INR: 1.3 (ref 0.00–1.49)

## 2012-04-01 MED ORDER — WARFARIN SODIUM 5 MG PO TABS
5.0000 mg | ORAL_TABLET | Freq: Once | ORAL | Status: AC
  Administered 2012-04-01: 5 mg via ORAL
  Filled 2012-04-01: qty 1

## 2012-04-01 MED ORDER — ACETAMINOPHEN 10 MG/ML IV SOLN
1000.0000 mg | Freq: Four times a day (QID) | INTRAVENOUS | Status: AC
  Administered 2012-04-01 (×4): 1000 mg via INTRAVENOUS
  Filled 2012-04-01 (×6): qty 100

## 2012-04-01 MED ORDER — HYDROMORPHONE 0.3 MG/ML IV SOLN
INTRAVENOUS | Status: DC
  Administered 2012-04-01: 09:00:00 via INTRAVENOUS
  Administered 2012-04-01: 0.599 mg via INTRAVENOUS
  Administered 2012-04-02: 0.4 mg via INTRAVENOUS
  Filled 2012-04-01: qty 25

## 2012-04-01 MED ORDER — ENOXAPARIN SODIUM 30 MG/0.3ML ~~LOC~~ SOLN
30.0000 mg | Freq: Two times a day (BID) | SUBCUTANEOUS | Status: DC
  Administered 2012-04-01: 30 mg via SUBCUTANEOUS
  Filled 2012-04-01 (×3): qty 0.3

## 2012-04-01 MED ORDER — SODIUM CHLORIDE 0.9 % IJ SOLN: 9.0000 mL | INTRAMUSCULAR | Status: DC | PRN

## 2012-04-01 MED ORDER — CYCLOBENZAPRINE HCL 10 MG PO TABS
10.0000 mg | ORAL_TABLET | Freq: Three times a day (TID) | ORAL | Status: DC | PRN
  Administered 2012-04-02 – 2012-04-05 (×6): 10 mg via ORAL
  Filled 2012-04-01 (×6): qty 1

## 2012-04-01 MED ORDER — HYDROMORPHONE HCL 2 MG PO TABS
2.0000 mg | ORAL_TABLET | ORAL | Status: DC | PRN
  Administered 2012-04-01: 2 mg via ORAL
  Administered 2012-04-02: 4 mg via ORAL
  Administered 2012-04-02 (×2): 2 mg via ORAL
  Administered 2012-04-02 – 2012-04-05 (×11): 4 mg via ORAL
  Filled 2012-04-01 (×7): qty 2
  Filled 2012-04-01: qty 1
  Filled 2012-04-01: qty 2
  Filled 2012-04-01: qty 1
  Filled 2012-04-01 (×6): qty 2

## 2012-04-01 NOTE — Addendum Note (Signed)
Addendum  created 04/01/12 1039 by Gaylan Gerold, MD   Modules edited:Inpatient Notes, Orders

## 2012-04-01 NOTE — Progress Notes (Signed)
Physical Therapy Treatment Patient Details Name: Cassandra Ortega MRN: [id number] DOB: 11-Mar-1956 Today's Date: 04/01/2012 Time: 4098-1191 PT Time Calculation (min): 24 min  PT Assessment / Plan / Recommendation Comments on Treatment Session  OOB deferred - epidural just removed and full sensation not yet present - esp L LE    Follow Up Recommendations  Post acute inpatient     Does the patient have the potential to tolerate intense rehabilitation  Yes, Recommend IP Rehab Screening  Barriers to Discharge        Equipment Recommendations  Rolling walker with 5" wheels    Recommendations for Other Services OT consult  Frequency 7X/week   Plan Discharge plan remains appropriate    Precautions / Restrictions Precautions Precautions: Knee;Fall Required Braces or Orthoses: Knee Immobilizer - Right;Knee Immobilizer - Left Knee Immobilizer - Right: Discontinue once straight leg raise with < 10 degree lag Knee Immobilizer - Left: Discontinue once straight leg raise with < 10 degree lag Restrictions Weight Bearing Restrictions: No RLE Weight Bearing: Weight bearing as tolerated LLE Weight Bearing: Weight bearing as tolerated   Pertinent Vitals/Pain 3-4/10    Mobility       Exercises Total Joint Exercises Ankle Circles/Pumps: AROM;Supine;Both;20 reps Quad Sets: AROM;AAROM;Both;Supine;20 reps Heel Slides: AAROM;Both;Supine;15 reps Straight Leg Raises: AAROM;Both;Supine;15 reps   PT Diagnosis:    PT Problem List:   PT Treatment Interventions:     PT Goals Acute Rehab PT Goals PT Goal Formulation: With patient Time For Goal Achievement: 04/06/12 Potential to Achieve Goals: Good  Visit Information  Last PT Received On: 04/01/12 Assistance Needed: +1    Subjective Data  Subjective: They just took my epidural out Patient Stated Goal: Rehab and home to resume previous lifestyle with decreased pain   Cognition  Overall Cognitive Status: Appears within functional limits for  tasks assessed/performed Arousal/Alertness: Awake/alert Orientation Level: Appears intact for tasks assessed Behavior During Session: [facility] for tasks performed    Balance     End of Session PT - End of Session Activity Tolerance: Patient tolerated treatment well Patient left: in bed;with call bell/phone within reach   GP     Cassandra Ortega 04/01/2012, 1:01 PM

## 2012-04-01 NOTE — Progress Notes (Signed)
Physical Therapy Treatment Patient Details Name: Cassandra Ortega MRN: [id number] DOB: Nov 27, 1955 Today's Date: 04/01/2012 Time: 8469-6295 PT Time Calculation (min): 13 min  PT Assessment / Plan / Recommendation Comments on Treatment Session       Follow Up Recommendations  Post acute inpatient     Does the patient have the potential to tolerate intense rehabilitation  Yes, Recommend IP Rehab Screening  Barriers to Discharge        Equipment Recommendations  Rolling walker with 5" wheels    Recommendations for Other Services OT consult  Frequency 7X/week   Plan Discharge plan remains appropriate    Precautions / Restrictions Precautions Precautions: Knee;Fall Required Braces or Orthoses: Knee Immobilizer - Right;Knee Immobilizer - Left Knee Immobilizer - Right: Discontinue once straight leg raise with < 10 degree lag Knee Immobilizer - Left: Discontinue once straight leg raise with < 10 degree lag Restrictions Weight Bearing Restrictions: No RLE Weight Bearing: Weight bearing as tolerated LLE Weight Bearing: Weight bearing as tolerated   Pertinent Vitals/Pain 2/10 bil knees    Mobility  Bed Mobility Bed Mobility: Sit to Supine Supine to Sit: 1: +2 Total assist Supine to Sit: Patient Percentage: 70% Sit to Supine: 1: +2 Total assist Sit to Supine: Patient Percentage: 60% Details for Bed Mobility Assistance: cues for sequence and assist with Bil LEs and to bring trunk to upright position Transfers Transfers: Sit to Stand;Stand to Sit Sit to Stand: 1: +2 Total assist;From chair/3-in-1;With upper extremity assist Sit to Stand: Patient Percentage: 70% Stand to Sit: 1: +2 Total assist;To bed;With upper extremity assist Stand to Sit: Patient Percentage: 70% Details for Transfer Assistance: cues for use of UEs: cues and assist for management of LEs Ambulation/Gait Ambulation/Gait Assistance: 1: +2 Total assist Ambulation/Gait: Patient Percentage: 70% Ambulation Distance  (Feet): 4 Feet Assistive device: Rolling walker Ambulation/Gait Assistance Details: cues for sequence and position from RW Gait Pattern: Step-to pattern    Exercises     PT Diagnosis:    PT Problem List:   PT Treatment Interventions:     PT Goals Acute Rehab PT Goals PT Goal Formulation: With patient Time For Goal Achievement: 04/06/12 Potential to Achieve Goals: Good Pt will go Supine/Side to Sit: with min assist PT Goal: Supine/Side to Sit - Progress: Progressing toward goal Pt will go Sit to Supine/Side: with min assist PT Goal: Sit to Supine/Side - Progress: Progressing toward goal Pt will go Sit to Stand: with min assist PT Goal: Sit to Stand - Progress: Progressing toward goal Pt will go Stand to Sit: with min assist PT Goal: Stand to Sit - Progress: Progressing toward goal Pt will Ambulate: 16 - 50 feet;with min assist;with rolling walker PT Goal: Ambulate - Progress: Progressing toward goal  Visit Information  Last PT Received On: 04/01/12 Assistance Needed: +2 PT/OT Co-Evaluation/Treatment: Yes    Subjective Data  Subjective: I'm getting a little nauseous again Patient Stated Goal: Rehab and home to resume previous lifestyle with decreased pain   Cognition  Overall Cognitive Status: Appears within functional limits for tasks assessed/performed Arousal/Alertness: Awake/alert Orientation Level: Appears intact for tasks assessed Behavior During Session: [facility] for tasks performed    Balance     End of Session PT - End of Session Equipment Utilized During Treatment: Gait belt;Left knee immobilizer;Right knee immobilizer Activity Tolerance: Patient tolerated treatment well Patient left: with call bell/phone within reach;with family/visitor present;in bed Nurse Communication: Mobility status   GP     Cassandra Ortega 04/01/2012, 4:07 PM

## 2012-04-01 NOTE — Progress Notes (Signed)
Subjective: 2 Days Post-Op Procedure(s) (LRB): TOTAL KNEE BILATERAL (Bilateral) Patient reports pain as moderate.  She has had an increase in her pain thru the night. Will change her meds for better control and the fact that she continues to have some mild nausea. Patient seen in rounds for Dr. Lequita Halt. Patient is having problems with pain in the right knee more so than the left knee, requiring pain medications. Epidural to come out today.  Anticipate possible increase in pain temporarily after the epidural has been removed. Plan is to go Rehab after hospital stay.  Objective: Vital signs in last 24 hours: Temp:  [98.1 F (36.7 C)-99.2 F (37.3 C)] 99.2 F (37.3 C) (10/25 0510) Pulse Rate:  [95-121] 117  (10/25 0510) Resp:  [14-20] 16  (10/25 0510) BP: (113-147)/(60-79) 137/79 mmHg (10/25 0510) SpO2:  [98 %-100 %] 98 % (10/25 0510)  Intake/Output from previous day:  Intake/Output Summary (Last 24 hours) at 04/01/12 0757 Last data filed at 04/01/12 0600  Gross per 24 hour  Intake 2409.25 ml  Output   2500 ml  Net -90.75 ml    Intake/Output this shift: UOP 650 Positive 1614 overall  Labs:  Basename 04/01/12 0425 03/31/12 0445  HGB 9.7* 11.4*    Basename 04/01/12 0425 03/31/12 0445  WBC 8.6 10.1  RBC 3.36* 4.02  HCT 27.4* 32.0*  PLT 270 258    Basename 04/01/12 0425 03/31/12 0445  NA 133* 133*  K 3.8 4.4  CL 99 98  CO2 24 22  BUN 7 11  CREATININE 0.63 0.58  GLUCOSE 254* 316*  CALCIUM 8.7 8.6    Basename 04/01/12 0425 03/31/12 0445  LABPT -- --  INR 1.30 1.15    EXAM General - Patient is Alert, Appropriate and Oriented Extremity - Neurovascular intact Sensation intact distally Dorsiflexion/Plantar flexion intact No cellulitis present Dressing - dressing C/D/I, Both Incisions look great.  Some swelling on the right leg around the previous HV site. Motor Function - intact, moving foot and toes well on exam.    Past Medical History  Diagnosis Date   . Diabetes mellitus without complication 03-24-12    NIDDM x 15 yrs-oral meds  . H/O hiatal hernia   . GERD (gastroesophageal reflux disease) 03-24-12    OTC meds used as needed  . Hemorrhoids 03-24-12    not presently a problem  . Arthritis 03-24-12    osteoarthritis. Rt. knee cap shattered. Left femur and tibia fx-no hardware.  . No pertinent past medical history 03-24-12    "Flu Shot and Whooping Cough,Tetanus" done 03-23-12    Assessment/Plan: 2 Days Post-Op Procedure(s) (LRB): TOTAL KNEE BILATERAL (Bilateral) Principal Problem:  *OA (osteoarthritis) of knee Active Problems:  Postop Acute blood loss anemia  Postop Hyponatremia  Estimated Body mass index is 31.66 kg/(m^2) as calculated from the following:   Height as of this encounter: 5\' 0" (1.524 m).   Weight as of this encounter: 162 lb 2 oz(73.539 kg). Up with therapy Continue foley due to urinary output monitoring and she still has her epidural in place.  Will remove the catheter six hours after the epidural is removed.  Will need to note in chart when the epidural is pulled.  DVT Prophylaxis - Lovenox and Coumadin, Lovenox will not start until later this afternoon/evening though after the epidural has been removed for twelve hours.  Will need to note in chart when the epidural is pulled. Continue PCA  until tomorrow.  Anticipate possible increase in pain temporarily after  the epidural has been removed. Switch to PCA Dilaudid for better control.  Will give another round of IV Ofirmev due to her nausea and increase in pain. Weight-Bearing as tolerated to both legs   Avel Peace, PA-C

## 2012-04-01 NOTE — Progress Notes (Signed)
ANTICOAGULATION CONSULT NOTE - Follow Up Consult  Pharmacy Consult for Coumadin Indication: VTE prophylaxis  Allergies  Allergen Reactions  . Ivp Dye (Iodinated Diagnostic Agents) Hives, Swelling and Other (See Comments)    Sneezing     Patient Measurements: Height: 5' (152.4 cm) Weight: 162 lb 2 oz (73.539 kg) IBW/kg (Calculated) : 45.5   Labs:  Basename 04/01/12 0425 03/31/12 0445  HGB 9.7* 11.4*  HCT 27.4* 32.0*  PLT 270 258  APTT -- --  LABPROT 15.9* 14.5  INR 1.30 1.15  HEPARINUNFRC -- --  CREATININE 0.63 0.58  CKTOTAL -- --  CKMB -- --  TROPONINI -- --    Estimated Creatinine Clearance: 69.4 ml/min (by C-G formula based on Cr of 0.63).  Assessment:  70 yof s/p bilateral TKA 10/23 to start Coumadin for VTE prophylaxis. Epidural in place post-op. Coumadin started 10/24 with plans for epidural removal 10/24 and Lovenox to start 12 hours s/p epidural removal.   Coumadin score = 4  INR 1.30 (responded after 1 dose of 5mg ), epidural removed this AM ~1020 per chart documentation. Hgb down to 9.7, no bleeding/complications from anticoagulation noted  Goal of Therapy:  INR 2-3 Monitor platelets by anticoagulation protocol: Yes   Plan:   Coumadin 5mg  po x 1 tonight  Daily PT/INR  F/u MD order for Lovenox to start tonight around 2200.  Pharmacy will provide coumadin education today  Geoffry Paradise Thi 04/01/2012,12:27 PM

## 2012-04-01 NOTE — Progress Notes (Signed)
Epidural Note.  Pt seen and examined. Pain well controlled currently. 2/10. Old blood under Tegaderm. Catheter pulled without issue, tip intact. Spoke with Kathrine Cords, RN caring for Ms. Cassandra Ortega, and asked for PO pain medication to be given now as epidural wears off. Please call [phone] with any questions or concerns.

## 2012-04-01 NOTE — Progress Notes (Signed)
Physical Therapy Treatment Patient Details Name: Cassandra Ortega MRN: [id number] DOB: Mar 08, 1956 Today's Date: 04/01/2012 Time: 7829-5621 PT Time Calculation (min): 24 min  PT Assessment / Plan / Recommendation Comments on Treatment Session       Follow Up Recommendations  Post acute inpatient     Does the patient have the potential to tolerate intense rehabilitation  Yes, Recommend IP Rehab Screening  Barriers to Discharge        Equipment Recommendations  Rolling walker with 5" wheels    Recommendations for Other Services OT consult  Frequency 7X/week   Plan Discharge plan remains appropriate    Precautions / Restrictions Precautions Precautions: Knee;Fall Required Braces or Orthoses: Knee Immobilizer - Right;Knee Immobilizer - Left Knee Immobilizer - Right: Discontinue once straight leg raise with < 10 degree lag Knee Immobilizer - Left: Discontinue once straight leg raise with < 10 degree lag Restrictions Weight Bearing Restrictions: No RLE Weight Bearing: Weight bearing as tolerated LLE Weight Bearing: Weight bearing as tolerated   Pertinent Vitals/Pain 3/10   3/10 Mobility  Bed Mobility Bed Mobility: Supine to Sit Supine to Sit: 1: +2 Total assist Supine to Sit: Patient Percentage: 70% Transfers Transfers: Sit to Stand;Stand to Sit Sit to Stand: 1: +2 Total assist;From bed;From elevated surface;With upper extremity assist;From chair/3-in-1 Sit to Stand: Patient Percentage: 70% Stand to Sit: 1: +2 Total assist;To chair/3-in-1;With upper extremity assist Stand to Sit: Patient Percentage: 70% Details for Transfer Assistance: cues for use of UEs: cues and assist for management of LEs Ambulation/Gait Ambulation/Gait Assistance: 1: +2 Total assist Ambulation/Gait: Patient Percentage: 70% Ambulation Distance (Feet): 32 Feet Assistive device: Rolling walker Ambulation/Gait Assistance Details: cues for posture, sequence, position from RW Gait Pattern: Step-to pattern      Exercises     PT Diagnosis:    PT Problem List:   PT Treatment Interventions:     PT Goals Acute Rehab PT Goals PT Goal Formulation: With patient Time For Goal Achievement: 04/06/12 Potential to Achieve Goals: Good Pt will go Supine/Side to Sit: with min assist PT Goal: Supine/Side to Sit - Progress: Progressing toward goal Pt will go Sit to Supine/Side: with min assist PT Goal: Sit to Supine/Side - Progress: Progressing toward goal Pt will go Sit to Stand: with min assist PT Goal: Sit to Stand - Progress: Progressing toward goal Pt will go Stand to Sit: with min assist PT Goal: Stand to Sit - Progress: Progressing toward goal Pt will Ambulate: 16 - 50 feet;with min assist;with rolling walker PT Goal: Ambulate - Progress: Progressing toward goal  Visit Information  Last PT Received On: 04/01/12 Assistance Needed: +2 PT/OT Co-Evaluation/Treatment: Yes    Subjective Data  Subjective: I just wok up but lets try this Patient Stated Goal: Rehab and home to resume previous lifestyle with decreased pain   Cognition  Overall Cognitive Status: Appears within functional limits for tasks assessed/performed Arousal/Alertness: Awake/alert Orientation Level: Appears intact for tasks assessed Behavior During Session: [facility] for tasks performed    Balance     End of Session PT - End of Session Equipment Utilized During Treatment: Gait belt;Left knee immobilizer;Right knee immobilizer Activity Tolerance: Patient tolerated treatment well Patient left: in chair;with call bell/phone within reach;with family/visitor present Nurse Communication: Mobility status   GP     Arbie Reisz 04/01/2012, 4:03 PM

## 2012-04-01 NOTE — Evaluation (Signed)
Occupational Therapy Evaluation Patient Details Name: Cassandra Ortega MRN: [id number] DOB: 1955-06-12 Today's Date: 04/01/2012 Time: 7829-5621 OT Time Calculation (min): 35 min  OT Assessment / Plan / Recommendation Clinical Impression  This 56 year old female was admitted for Bil TKA.  She will benefit from skilled OT to increase independence with adls with min to mod A level goals in acute.      OT Assessment  Patient needs continued OT Services    Follow Up Recommendations  Skilled nursing facility    Barriers to Discharge      Equipment Recommendations  Rolling walker with 5" wheels    Recommendations for Other Services    Frequency  Min 2X/week    Precautions / Restrictions Precautions Precautions: Knee;Fall Required Braces or Orthoses: Knee Immobilizer - Right;Knee Immobilizer - Left Knee Immobilizer - Right: Discontinue once straight leg raise with < 10 degree lag Knee Immobilizer - Left: Discontinue once straight leg raise with < 10 degree lag Restrictions Weight Bearing Restrictions: No RLE Weight Bearing: Weight bearing as tolerated LLE Weight Bearing: Weight bearing as tolerated   Pertinent Vitals/Pain bil knees 5/10:  Repositioned and ice applied    ADL  Grooming: Performed;Wash/dry face;Set up Where Assessed - Grooming: Supine, head of bed up Upper Body Bathing: Performed;Set up Where Assessed - Upper Body Bathing: Supine, head of bed up Lower Body Bathing: Performed;+2 Total assistance Lower Body Bathing: Patient Percentage: 70% Where Assessed - Lower Body Bathing: Supine, head of bed up;Other (comment) (to stand for peri area) Upper Body Dressing: Performed;Minimal assistance (due to iv line) Where Assessed - Upper Body Dressing: Unsupported sitting Lower Body Dressing: Performed;Maximal assistance (socks only max A; see adl below) Where Assessed - Lower Body Dressing: Supine, head of bed up/long sit Toilet Transfer: Performed;+2 Total assistance Toilet  Transfer: Patient Percentage: 70% Toilet Transfer Method: Sit to stand (commode brought behind her) Acupuncturist: Bedside commode Equipment Used: Sock aid;Reacher;Rolling walker Transfers/Ambulation Related to ADLs: Pt walked with PT:  +2 for safety. Had nausea and felt dizzy once up:  BP 144 82 ADL Comments: Pt did not have clothes.  Educated on sock aid: needs max a due to not being able to lift legs.  Also educated on reacher:  max A due to inability to lift legs and KIs in the way.  Pt not ready for shower transfer.     OT Diagnosis: Generalized weakness  OT Problem List: Decreased strength;Decreased activity tolerance;Decreased knowledge of use of DME or AE;Decreased knowledge of precautions;Pain OT Treatment Interventions: Self-care/ADL training;DME and/or AE instruction;Therapeutic activities;Patient/family education;Balance training   OT Goals Acute Rehab OT Goals OT Goal Formulation: With patient Time For Goal Achievement: 04/08/12 Potential to Achieve Goals: Good ADL Goals Pt Will Perform Grooming: with min assist;Standing at sink (min guard) ADL Goal: Grooming - Progress: Goal set today Pt Will Perform Lower Body Bathing: with min assist;with adaptive equipment;Sit to stand from chair ADL Goal: Lower Body Bathing - Progress: Goal set today Pt Will Perform Lower Body Dressing: with mod assist;with adaptive equipment;Sit to stand from chair ADL Goal: Lower Body Dressing - Progress: Goal set today Pt Will Transfer to Toilet: with min assist;Stand pivot transfer;3-in-1 ADL Goal: Toilet Transfer - Progress: Goal set today Pt Will Perform Toileting - Hygiene: with min assist;Sit to stand from 3-in-1/toilet ADL Goal: Toileting - Hygiene - Progress: Goal set today  Visit Information  Last OT Received On: 04/01/12 Assistance Needed: +2 PT/OT Co-Evaluation/Treatment: Yes (part of session)  Subjective Data  Subjective: I'm a little dizzy.   Patient Stated Goal: Get  back to being independent   Prior Functioning     Home Living Lives With: Spouse Available Help at Discharge:  (plans [facility] for rehab) Prior Function Level of Independence: Independent Able to Take Stairs?: Yes Driving: Yes Communication Communication: No difficulties Dominant Hand: Right         Vision/Perception     Cognition  Overall Cognitive Status: Appears within functional limits for tasks assessed/performed Arousal/Alertness: Awake/alert Orientation Level: Appears intact for tasks assessed Behavior During Session: [facility] for tasks performed    Extremity/Trunk Assessment Right Upper Extremity Assessment RUE ROM/Strength/Tone: Within functional levels Left Upper Extremity Assessment LUE ROM/Strength/Tone: Within functional levels     Mobility Bed Mobility Supine to Sit: 1: +2 Total assist Supine to Sit: Patient Percentage: 70% Transfers Sit to Stand: 1: +2 Total assist;From bed;From elevated surface;With upper extremity assist;From chair/3-in-1 Sit to Stand: Patient Percentage: 70%     Shoulder Instructions     Exercise    Balance     End of Session OT - End of Session Activity Tolerance: Patient limited by fatigue (nausea and dizziness) Patient left: in chair;with call bell/phone within reach;with family/visitor present  GO     Ahnyla Mendel 04/01/2012, 2:10 PM Marica Otter, OTR/L [phone] 04/01/2012

## 2012-04-01 NOTE — Progress Notes (Signed)
CSW assisting with d/c planning. Pt  Has declined CIR placement option. She is requesting [facility] for ST SNF placement. SNF contacted and is initiating BCBS authorization. Bed offer will depend on successful negotiations between [facility] and [facility] regarding placement rate. CSW will continue to follow to assist with d/c planning to SNF.   Cori Razor LCSW [phone]

## 2012-04-01 NOTE — Progress Notes (Signed)
Rehab admissions - Evaluated for possible admission.  I met with patient and her daughter this am.  Patient prefers [facility].  Inpatient rehab would be her next choice.  Patient is worried that she will need a longer stay than the anticipated 1 week on inpatient rehab.  She has two daughters who live out-of-town.  Husband travels out-of-town.  I have shared this information with case manager, Colleen Can and with social worker, Dellie Burns.  Call me for questions.  #[phone]

## 2012-04-02 LAB — GLUCOSE, CAPILLARY
Glucose-Capillary: 184 mg/dL — ABNORMAL HIGH (ref 70–99)
Glucose-Capillary: 172 mg/dL — ABNORMAL HIGH (ref 70–99)

## 2012-04-02 LAB — BASIC METABOLIC PANEL
BUN: 4 mg/dL — ABNORMAL LOW (ref 6–23)
CO2: 27 mEq/L (ref 19–32)
Calcium: 8.9 mg/dL (ref 8.4–10.5)
GFR calc non Af Amer: >90 mL/min (ref >90)
Glucose, Bld: 221 mg/dL — ABNORMAL HIGH (ref 70–99)
Potassium: 3.5 mEq/L (ref 3.5–5.1)

## 2012-04-02 LAB — CBC
HCT: 26.4 % — ABNORMAL LOW (ref 36.0–46.0)
MCH: 28.8 pg (ref 26.0–34.0)
MCHC: 35.2 g/dL (ref 30.0–36.0)
RDW: 12.9 % (ref 11.5–15.5)

## 2012-04-02 LAB — PROTIME-INR
INR: 3.57 — ABNORMAL HIGH (ref 0.00–1.49)
Prothrombin Time: 33.6 seconds — ABNORMAL HIGH (ref 11.6–15.2)

## 2012-04-02 MED ORDER — HYDROMORPHONE HCL PF 1 MG/ML IJ SOLN: 0.5000 mg | INTRAMUSCULAR | Status: DC | PRN

## 2012-04-02 NOTE — Progress Notes (Signed)
   Subjective: 3 Days Post-Op Procedure(s) (LRB): TOTAL KNEE BILATERAL (Bilateral) Patient reports pain as mild.  Much better than yesterday. Nausea now resolved Plan is to go Skilled nursing facility after hospital stay.  Objective: Vital signs in last 24 hours: Temp:  [97.6 F (36.4 C)-98.4 F (36.9 C)] 97.6 F (36.4 C) (10/26 6962) Pulse Rate:  [102-118] 118  (10/26 0632) Resp:  [16-18] 16  (10/26 9528) BP: (143-149)/(77-86) 149/86 mmHg (10/26 0632) SpO2:  [98 %-100 %] 100 % (10/26 4132)  Intake/Output from previous day:  Intake/Output Summary (Last 24 hours) at 04/02/12 0801 Last data filed at 04/02/12 4401  Gross per 24 hour  Intake 2364.83 ml  Output   3865 ml  Net -1500.17 ml    Intake/Output this shift:    Labs:  Basename 04/02/12 0540 04/01/12 0425 03/31/12 0445  HGB 9.3* 9.7* 11.4*    Basename 04/02/12 0540 04/01/12 0425  WBC 9.5 8.6  RBC 3.23* 3.36*  HCT 26.4* 27.4*  PLT 237 270    Basename 04/02/12 0540 04/01/12 0425  NA 137 133*  K 3.5 3.8  CL 101 99  CO2 27 24  BUN 4* 7  CREATININE 0.51 0.63  GLUCOSE 221* 254*  CALCIUM 8.9 8.7    Basename 04/02/12 0540 04/01/12 0425  LABPT -- --  INR 3.57* 1.30    EXAM General - Patient is Alert, Appropriate and Oriented Extremity - Neurologically intact Neurovascular intact Dorsiflexion/Plantar flexion intact Incision: dressing C/D/I No cellulitis present Compartment soft Dressing/Incision - clean, dry, no drainage Motor Function - intact, moving foot and toes well on exam.   Past Medical History  Diagnosis Date  . Diabetes mellitus without complication 03-24-12    NIDDM x 15 yrs-oral meds  . H/O hiatal hernia   . GERD (gastroesophageal reflux disease) 03-24-12    OTC meds used as needed  . Hemorrhoids 03-24-12    not presently a problem  . Arthritis 03-24-12    osteoarthritis. Rt. knee cap shattered. Left femur and tibia fx-no hardware.  . No pertinent past medical history 03-24-12   "Flu Shot and Whooping Cough,Tetanus" done 03-23-12    Assessment/Plan: 3 Days Post-Op Procedure(s) (LRB): TOTAL KNEE BILATERAL (Bilateral) Principal Problem:  *OA (osteoarthritis) of knee Active Problems:  Postop Acute blood loss anemia  Postop Hyponatremia   Advance diet Up with therapy Discharge to SNF on 10/28 D/C PCA  DVT Prophylaxis - Coumadin Weight-Bearing as tolerated to bilateral legs  Cassandra Ortega V 04/02/2012, 8:01 AM

## 2012-04-02 NOTE — Progress Notes (Signed)
Physical Therapy Treatment Patient Details Name: Cassandra Ortega MRN: [id number] DOB: [birth year] Today's Date: 04/02/2012 Time: 4098-1191 PT Time Calculation (min): 33 min  PT Assessment / Plan / Recommendation Comments on Treatment Session  OOB deferred - epidural just removed and full sensation not yet present - esp L LE    Follow Up Recommendations  Post acute inpatient     Does the patient have the potential to tolerate intense rehabilitation  Yes, Recommend IP Rehab Screening  Barriers to Discharge        Equipment Recommendations  Rolling walker with 5" wheels    Recommendations for Other Services OT consult  Frequency 7X/week   Plan Discharge plan remains appropriate    Precautions / Restrictions Precautions Precautions: Knee;Fall Required Braces or Orthoses: Knee Immobilizer - Right;Knee Immobilizer - Left Knee Immobilizer - Right: Discontinue once straight leg raise with < 10 degree lag Knee Immobilizer - Left: Discontinue once straight leg raise with < 10 degree lag Restrictions Weight Bearing Restrictions: No RLE Weight Bearing: Weight bearing as tolerated LLE Weight Bearing: Weight bearing as tolerated   Pertinent Vitals/Pain 4/10; cold packs provided    Mobility  Bed Mobility Bed Mobility: Supine to Sit Supine to Sit: 3: Mod assist Details for Bed Mobility Assistance: cues for sequence and assist with Bil LEs and to bring trunk to upright position Transfers Transfers: Sit to Stand;Stand to Sit Sit to Stand: 3: Mod assist;From chair/3-in-1;From bed;With armrests;With upper extremity assist Stand to Sit: 3: Mod assist;With armrests;To chair/3-in-1 Details for Transfer Assistance: cues for use of UEs: cues and assist for management of LEs Ambulation/Gait Ambulation/Gait Assistance: 3: Mod assist Ambulation Distance (Feet): 61 Feet (and 22) Assistive device: Rolling walker Ambulation/Gait Assistance Details: cues for posture, stride length and position from  RW Gait Pattern: Step-to pattern    Exercises     PT Diagnosis:    PT Problem List:   PT Treatment Interventions:     PT Goals Acute Rehab PT Goals PT Goal Formulation: With patient Time For Goal Achievement: 04/06/12 Potential to Achieve Goals: Good Pt will go Supine/Side to Sit: with min assist PT Goal: Supine/Side to Sit - Progress: Progressing toward goal Pt will go Sit to Supine/Side: with min assist PT Goal: Sit to Supine/Side - Progress: Progressing toward goal Pt will go Sit to Stand: with min assist PT Goal: Sit to Stand - Progress: Progressing toward goal Pt will go Stand to Sit: with min assist PT Goal: Stand to Sit - Progress: Progressing toward goal Pt will Ambulate: 16 - 50 feet;with min assist;with rolling walker PT Goal: Ambulate - Progress: Progressing toward goal  Visit Information  Last PT Received On: 04/09/12 Assistance Needed: +1    Subjective Data  Subjective: I am doing much better and ready to get up Patient Stated Goal: Rehab and home to resume previous lifestyle with decreased pain   Cognition  Overall Cognitive Status: Appears within functional limits for tasks assessed/performed Arousal/Alertness: Awake/alert Orientation Level: Appears intact for tasks assessed Behavior During Session: [facility] for tasks performed    Balance     End of Session PT - End of Session Equipment Utilized During Treatment: Gait belt;Left knee immobilizer;Right knee immobilizer Activity Tolerance: Patient tolerated treatment well Patient left: in chair;with call bell/phone within reach;with family/visitor present Nurse Communication: Mobility status   GP     Vedika Dumlao 04/02/2012, 1:42 PM

## 2012-04-02 NOTE — Progress Notes (Signed)
Physical Therapy Treatment Patient Details Name: Cassandra Ortega MRN: [id number] DOB: 1955/07/22 Today's Date: 04/02/2012 Time: 4098-1191 PT Time Calculation (min): 35 min  PT Assessment / Plan / Recommendation Comments on Treatment Session  ther ex deferred - pt requesting additional pain meds prior to    Follow Up Recommendations  Post acute inpatient     Does the patient have the potential to tolerate intense rehabilitation  Yes, Recommend IP Rehab Screening  Barriers to Discharge        Equipment Recommendations  Rolling walker with 5" wheels    Recommendations for Other Services OT consult  Frequency 7X/week   Plan Discharge plan remains appropriate    Precautions / Restrictions Precautions Precautions: Knee;Fall Required Braces or Orthoses: Knee Immobilizer - Right;Knee Immobilizer - Left Knee Immobilizer - Right: Discontinue once straight leg raise with < 10 degree lag Knee Immobilizer - Left: Discontinue once straight leg raise with < 10 degree lag Restrictions Weight Bearing Restrictions: No RLE Weight Bearing: Weight bearing as tolerated LLE Weight Bearing: Weight bearing as tolerated   Pertinent Vitals/Pain     Mobility  Bed Mobility Bed Mobility: Sit to Supine Supine to Sit: 3: Mod assist Sit to Supine: 3: Mod assist Details for Bed Mobility Assistance: cues for sequence and assist with Bil LEs and to bring trunk to upright position Transfers Transfers: Sit to Stand;Stand to Sit Sit to Stand: 3: Mod assist;From chair/3-in-1;With armrests;With upper extremity assist Stand to Sit: 3: Mod assist;With armrests;To chair/3-in-1;To bed;With upper extremity assist Details for Transfer Assistance: cues for use of UEs: cues and assist for management of LEs Ambulation/Gait Ambulation/Gait Assistance: 3: Mod assist Ambulation Distance (Feet): 22 Feet (twice) Assistive device: Rolling walker Ambulation/Gait Assistance Details: min cues for position from RW and  posture Gait Pattern: Step-to pattern    Exercises     PT Diagnosis:    PT Problem List:   PT Treatment Interventions:     PT Goals Acute Rehab PT Goals PT Goal Formulation: With patient Time For Goal Achievement: 04/06/12 Potential to Achieve Goals: Good Pt will go Supine/Side to Sit: with min assist PT Goal: Supine/Side to Sit - Progress: Progressing toward goal Pt will go Sit to Supine/Side: with min assist PT Goal: Sit to Supine/Side - Progress: Progressing toward goal Pt will go Sit to Stand: with min assist PT Goal: Sit to Stand - Progress: Progressing toward goal Pt will go Stand to Sit: with min assist PT Goal: Stand to Sit - Progress: Progressing toward goal Pt will Ambulate: 16 - 50 feet;with min assist;with rolling walker PT Goal: Ambulate - Progress: Progressing toward goal  Visit Information  Last PT Received On: 04/09/12 Assistance Needed: +1    Subjective Data  Subjective: I'm feeling a little nauseous and need to use the bathroom Patient Stated Goal: Rehab and home to resume previous lifestyle with decreased pain   Cognition  Overall Cognitive Status: Appears within functional limits for tasks assessed/performed Arousal/Alertness: Awake/alert Orientation Level: Appears intact for tasks assessed Behavior During Session: [facility] for tasks performed    Balance     End of Session PT - End of Session Equipment Utilized During Treatment: Gait belt;Left knee immobilizer;Right knee immobilizer Activity Tolerance: Patient tolerated treatment well Patient left: with call bell/phone within reach;with family/visitor present;in bed Nurse Communication: Mobility status   GP     Vernie Piet 04/02/2012, 1:47 PM

## 2012-04-02 NOTE — Progress Notes (Signed)
ANTICOAGULATION CONSULT NOTE - Follow Up Consult  Pharmacy Consult for Coumadin Indication: VTE prophylaxis  Allergies  Allergen Reactions  . Ivp Dye (Iodinated Diagnostic Agents) Hives, Swelling and Other (See Comments)    Sneezing     Patient Measurements: Height: 5' (152.4 cm) Weight: 162 lb 2 oz (73.539 kg) IBW/kg (Calculated) : 45.5   Labs:  Basename 04/02/12 0540 04/01/12 0425 03/31/12 0445  HGB 9.3* 9.7* --  HCT 26.4* 27.4* 32.0*  PLT 237 270 258  APTT -- -- --  LABPROT 33.6* 15.9* 14.5  INR 3.57* 1.30 1.15  HEPARINUNFRC -- -- --  CREATININE 0.51 0.63 0.58  CKTOTAL -- -- --  CKMB -- -- --  TROPONINI -- -- --    Estimated Creatinine Clearance: 69.4 ml/min (by C-G formula based on Cr of 0.51).  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  Coumadin started 10/24 for VTE prophylaxis with plans for epidural removal 10/24 and Lovenox to start 12 hours s/p epidural removal.   Lovenox started last night but discontinued this AM d/t high INR.  Coumadin score = 4  INR jumped unexpectedly to 3.57 from 1.30 (response after 2 doses of 5mg ).  CBC stable.  No bleeding/complications from anticoagulation noted.  Pharmacy provided coumadin education 10/25.  Goal of Therapy:  INR 2-3 Monitor platelets by anticoagulation protocol: Yes   Plan:   No warfarin dose today. Follow up INR in AM.  Clance Boll 04/02/2012,10:58 AM

## 2012-04-02 NOTE — Progress Notes (Signed)
Physical Therapy Treatment Patient Details Name: Cassandra Ortega MRN: [id number] DOB: 12/29/1955 Today's Date: 04/02/2012 Time: 4098-1191 PT Time Calculation (min): 32 min  PT Assessment / Plan / Recommendation Comments on Treatment Session  ther ex deferred - pt requesting additional pain meds prior to    Follow Up Recommendations  Post acute inpatient     Does the patient have the potential to tolerate intense rehabilitation  Yes, Recommend IP Rehab Screening  Barriers to Discharge        Equipment Recommendations  Rolling walker with 5" wheels    Recommendations for Other Services OT consult  Frequency 7X/week   Plan Discharge plan remains appropriate    Precautions / Restrictions Precautions Precautions: Knee;Fall Required Braces or Orthoses: Knee Immobilizer - Right;Knee Immobilizer - Left Knee Immobilizer - Right: Discontinue once straight leg raise with < 10 degree lag Knee Immobilizer - Left: Discontinue once straight leg raise with < 10 degree lag Restrictions Weight Bearing Restrictions: No RLE Weight Bearing: Weight bearing as tolerated LLE Weight Bearing: Weight bearing as tolerated   Pertinent Vitals/Pain     Mobility  Bed Mobility Bed Mobility: Sit to Supine Sit to Supine: 3: Mod assist Details for Bed Mobility Assistance: cues for sequence and assist with Bil LEs and to bring trunk to upright position Transfers Transfers: Sit to Stand;Stand to Sit Sit to Stand: 3: Mod assist;From chair/3-in-1;With armrests;With upper extremity assist Stand to Sit: 3: Mod assist;With armrests;To chair/3-in-1;To bed;With upper extremity assist Details for Transfer Assistance: cues for use of UEs: cues and assist for management of LEs Ambulation/Gait Ambulation/Gait Assistance: 3: Mod assist Ambulation Distance (Feet): 22 Feet (twice) Assistive device: Rolling walker Ambulation/Gait Assistance Details: min cues for position from RW and posture Gait Pattern: Step-to pattern      Exercises Total Joint Exercises Ankle Circles/Pumps: AROM;Supine;Both;20 reps Quad Sets: AROM;AAROM;Both;Supine;20 reps Heel Slides: AAROM;Both;Supine;15 reps Straight Leg Raises: AAROM;Both;Supine;15 reps   PT Diagnosis:    PT Problem List:   PT Treatment Interventions:     PT Goals Acute Rehab PT Goals PT Goal Formulation: With patient Time For Goal Achievement: 04/06/12 Potential to Achieve Goals: Good Pt will go Supine/Side to Sit: with min assist PT Goal: Supine/Side to Sit - Progress: Progressing toward goal Pt will go Sit to Supine/Side: with min assist PT Goal: Sit to Supine/Side - Progress: Progressing toward goal Pt will go Sit to Stand: with min assist PT Goal: Sit to Stand - Progress: Progressing toward goal Pt will go Stand to Sit: with min assist PT Goal: Stand to Sit - Progress: Progressing toward goal Pt will Ambulate: 16 - 50 feet;with min assist;with rolling walker PT Goal: Ambulate - Progress: Progressing toward goal  Visit Information  Last PT Received On: 03/21/12 Assistance Needed: +1    Subjective Data  Subjective: I'm really tired Patient Stated Goal: Rehab and home to resume previous lifestyle with decreased pain   Cognition  Overall Cognitive Status: Appears within functional limits for tasks assessed/performed Arousal/Alertness: Awake/alert Orientation Level: Appears intact for tasks assessed Behavior During Session: [facility] for tasks performed    Balance     End of Session PT - End of Session Equipment Utilized During Treatment: Gait belt;Left knee immobilizer;Right knee immobilizer Activity Tolerance: Patient tolerated treatment well Patient left: with call bell/phone within reach;with family/visitor present;in bed Nurse Communication: Mobility status CPM Right Knee CPM Right Knee: On   GP     Cassandra Ortega 04/02/2012, 3:43 PM

## 2012-04-03 LAB — GLUCOSE, CAPILLARY: Glucose-Capillary: 191 mg/dL — ABNORMAL HIGH (ref 70–99)

## 2012-04-03 LAB — PROTIME-INR
INR: 2.2 — ABNORMAL HIGH (ref 0.00–1.49)
Prothrombin Time: 23.5 seconds — ABNORMAL HIGH (ref 11.6–15.2)

## 2012-04-03 MED ORDER — WARFARIN SODIUM 1 MG PO TABS
1.0000 mg | ORAL_TABLET | Freq: Once | ORAL | Status: AC
  Administered 2012-04-03: 1 mg via ORAL
  Filled 2012-04-03: qty 1

## 2012-04-03 NOTE — Progress Notes (Signed)
Physical Therapy Treatment Patient Details Name: Cassandra Ortega MRN: [id number] DOB: March 06, 1956 Today's Date: 04/03/2012 Time: 0913-1000 PT Time Calculation (min): 47 min  PT Assessment / Plan / Recommendation Comments on Treatment Session  Limited by onset of nausea - RN aware    Follow Up Recommendations  Post acute inpatient     Does the patient have the potential to tolerate intense rehabilitation  Yes, Recommend IP Rehab Screening  Barriers to Discharge        Equipment Recommendations  Rolling walker with 5" wheels    Recommendations for Other Services OT consult  Frequency 7X/week   Plan Discharge plan remains appropriate    Precautions / Restrictions Precautions Precautions: Knee;Fall Required Braces or Orthoses: Knee Immobilizer - Right;Knee Immobilizer - Left Knee Immobilizer - Right: Discontinue once straight leg raise with < 10 degree lag Knee Immobilizer - Left: Discontinue once straight leg raise with < 10 degree lag (Pt performed IND SLR on L LE this am) Restrictions Weight Bearing Restrictions: No RLE Weight Bearing: Weight bearing as tolerated LLE Weight Bearing: Weight bearing as tolerated   Pertinent Vitals/Pain 4/10; premedicated, ice packs provided    Mobility  Bed Mobility Bed Mobility: Supine to Sit Supine to Sit: 4: Min assist;3: Mod assist Details for Bed Mobility Assistance: cues for sequence and assist with Bil LEs  Transfers Transfers: Sit to Stand;Stand to Sit Sit to Stand: 3: Mod assist Stand to Sit: 3: Mod assist Details for Transfer Assistance: cues for use of UEs: cues and assist for management of LEs Ambulation/Gait Ambulation/Gait Assistance: 3: Mod assist;4: Min assist Ambulation Distance (Feet): 25 Feet (twice) Assistive device: Rolling walker Ambulation/Gait Assistance Details: min cues for posture and position from RW Gait Pattern: Step-to pattern    Exercises Total Joint Exercises Ankle Circles/Pumps: AROM;Supine;Both;20  reps Quad Sets: AROM;Both;Supine;20 reps Heel Slides: AAROM;Both;Supine;20 reps Straight Leg Raises: AAROM;Both;Supine;AROM;20 reps   PT Diagnosis:    PT Problem List:   PT Treatment Interventions:     PT Goals Acute Rehab PT Goals PT Goal Formulation: With patient Time For Goal Achievement: 04/06/12 Potential to Achieve Goals: Good Pt will go Supine/Side to Sit: with min assist PT Goal: Supine/Side to Sit - Progress: Progressing toward goal Pt will go Sit to Supine/Side: with min assist PT Goal: Sit to Supine/Side - Progress: Progressing toward goal Pt will go Sit to Stand: with min assist PT Goal: Sit to Stand - Progress: Progressing toward goal Pt will go Stand to Sit: with min assist PT Goal: Stand to Sit - Progress: Progressing toward goal Pt will Ambulate: 16 - 50 feet;with min assist;with rolling walker PT Goal: Ambulate - Progress: Progressing toward goal  Visit Information  Last PT Received On: 04/03/12 Assistance Needed: +1    Subjective Data  Subjective: I'm doing better today Patient Stated Goal: Rehab and home to resume previous lifestyle with decreased pain   Cognition  Overall Cognitive Status: Appears within functional limits for tasks assessed/performed Arousal/Alertness: Awake/alert Orientation Level: Appears intact for tasks assessed Behavior During Session: [facility] for tasks performed    Balance     End of Session PT - End of Session Equipment Utilized During Treatment: Gait belt;Right knee immobilizer Activity Tolerance: Patient tolerated treatment well Patient left: in chair;with call bell/phone within reach Nurse Communication: Mobility status   GP     Cassandra Ortega 04/03/2012, 12:19 PM

## 2012-04-03 NOTE — Progress Notes (Signed)
Physical Therapy Treatment Patient Details Name: Cassandra Ortega MRN: [id number] DOB: 1955-12-01 Today's Date: 04/03/2012 Time: 1343-1410 PT Time Calculation (min): 27 min  PT Assessment / Plan / Recommendation Comments on Treatment Session       Follow Up Recommendations  Post acute inpatient     Does the patient have the potential to tolerate intense rehabilitation  Yes, Recommend IP Rehab Screening  Barriers to Discharge        Equipment Recommendations  Rolling walker with 5" wheels    Recommendations for Other Services OT consult  Frequency 7X/week   Plan Discharge plan remains appropriate    Precautions / Restrictions Precautions Precautions: Knee;Fall Required Braces or Orthoses: Knee Immobilizer - Right;Knee Immobilizer - Left Knee Immobilizer - Right: Discontinue once straight leg raise with < 10 degree lag Knee Immobilizer - Left: Discontinue once straight leg raise with < 10 degree lag Restrictions RLE Weight Bearing: Weight bearing as tolerated LLE Weight Bearing: Weight bearing as tolerated   Pertinent Vitals/Pain 4/10, premedicated, ice packs provided    Mobility  Bed Mobility Bed Mobility: Sit to Supine Sit to Supine: 4: Min assist;3: Mod assist Details for Bed Mobility Assistance: cues for sequence and assist with Bil LEs  Transfers Transfers: Sit to Stand;Stand to Sit Sit to Stand: 4: Min assist;3: Mod assist Stand to Sit: 4: Min assist Details for Transfer Assistance: cues for use of UEs: cues and assist for management of LEs Ambulation/Gait Ambulation/Gait Assistance: 4: Min assist Ambulation Distance (Feet): 100 Feet (and 12' and 25') Assistive device: Rolling walker Ambulation/Gait Assistance Details: min cues for sequence, stride length and position from RW Gait Pattern: Step-to pattern    Exercises     PT Diagnosis:    PT Problem List:   PT Treatment Interventions:     PT Goals Acute Rehab PT Goals PT Goal Formulation: With patient Time  For Goal Achievement: 04/06/12 Potential to Achieve Goals: Good Pt will go Supine/Side to Sit: with min assist PT Goal: Supine/Side to Sit - Progress: Progressing toward goal Pt will go Sit to Supine/Side: with min assist PT Goal: Sit to Supine/Side - Progress: Progressing toward goal Pt will go Sit to Stand: with min assist PT Goal: Sit to Stand - Progress: Progressing toward goal Pt will go Stand to Sit: with min assist PT Goal: Stand to Sit - Progress: Progressing toward goal Pt will Ambulate: with min assist;with rolling walker;51 - 150 feet PT Goal: Ambulate - Progress: Progressing toward goal  Visit Information  Last PT Received On: 04/03/12 Assistance Needed: +1    Subjective Data  Patient Stated Goal: Rehab and home to resume previous lifestyle with decreased pain   Cognition  Overall Cognitive Status: Appears within functional limits for tasks assessed/performed Arousal/Alertness: Awake/alert Orientation Level: Appears intact for tasks assessed Behavior During Session: [facility] for tasks performed    Balance     End of Session PT - End of Session Equipment Utilized During Treatment: Gait belt Activity Tolerance: Patient tolerated treatment well Patient left: in bed;with call bell/phone within reach Nurse Communication: Mobility status   GP     Cassandra Ortega 04/03/2012, 3:14 PM

## 2012-04-03 NOTE — Progress Notes (Signed)
ANTICOAGULATION CONSULT NOTE - Follow Up Consult  Pharmacy Consult for Coumadin Indication: VTE prophylaxis  Allergies  Allergen Reactions  . Ivp Dye (Iodinated Diagnostic Agents) Hives, Swelling and Other (See Comments)    Sneezing     Patient Measurements: Height: 5' (152.4 cm) Weight: 162 lb 2 oz (73.539 kg) IBW/kg (Calculated) : 45.5   Labs:  Basename 04/03/12 0439 04/02/12 0540 04/01/12 0425  HGB -- 9.3* 9.7*  HCT -- 26.4* 27.4*  PLT -- 237 270  APTT -- -- --  LABPROT 23.5* 33.6* 15.9*  INR 2.20* 3.57* 1.30  HEPARINUNFRC -- -- --  CREATININE -- 0.51 0.63  CKTOTAL -- -- --  CKMB -- -- --  TROPONINI -- -- --    Estimated Creatinine Clearance: 69.4 ml/min (by C-G formula based on Cr of 0.51).  Assessment:  69 yof s/p bilateral TKA 10/23.  Coumadin started 10/24 for VTE prophylaxis with plans for epidural removal 10/24 and Lovenox to start 12 hours s/p epidural removal.   Lovenox started 10/25 PM but was discontinued 10/26 d/t high INR.  Coumadin score = 4  INR jumped unexpectedly to 3.57 from 1.30 yesterday (response after 2 doses of 5mg ).  Today, INR 2.2 after holding dose last night.  Will restart warfarin at lower dose today.  No CBC today but has been stable.  No bleeding/complications from anticoagulation noted.  Pharmacy provided coumadin education 10/25.  Goal of Therapy:  INR 2-3 Monitor platelets by anticoagulation protocol: Yes   Plan:   Warfarin 1 mg po once today. Follow up INR in AM.  Clance Boll 04/03/2012,9:33 AM

## 2012-04-03 NOTE — Progress Notes (Signed)
Small Blanchable redness to left upper back restore dressing applied to help prevent breakdown. Cassandra Ortega 7:03 AM

## 2012-04-03 NOTE — Progress Notes (Signed)
Subjective: Doing Fine. Dressing changed and no wound problems.   Objective: Vital signs in last 24 hours: Temp:  [98.3 F (36.8 C)-98.6 F (37 C)] 98.6 F (37 C) (10/27 0530) Pulse Rate:  [105-108] 105  (10/27 0530) Resp:  [16] 16  (10/27 0733) BP: (117-134)/(73-78) 134/78 mmHg (10/27 0530) SpO2:  [98 %-100 %] 98 % (10/27 0733)  Intake/Output from previous day: 10/26 0701 - 10/27 0700 In: 780 [P.O.:780] Out: 1075 [Urine:1075] Intake/Output this shift:     Basename 04/02/12 0540 04/01/12 0425  HGB 9.3* 9.7*    Basename 04/02/12 0540 04/01/12 0425  WBC 9.5 8.6  RBC 3.23* 3.36*  HCT 26.4* 27.4*  PLT 237 270    Basename 04/02/12 0540 04/01/12 0425  NA 137 133*  K 3.5 3.8  CL 101 99  CO2 27 24  BUN 4* 7  CREATININE 0.51 0.63  GLUCOSE 221* 254*  CALCIUM 8.9 8.7    Basename 04/03/12 0439 04/02/12 0540  LABPT -- --  INR 2.20* 3.57*    No cellulitis present  Assessment/Plan: Dc plans for Monday or Tuesday   Cassandra Ortega A 04/03/2012, 7:53 AM

## 2012-04-03 NOTE — Progress Notes (Signed)
Subjective: Comfortable today,and no post-op problems.   Objective: Vital signs in last 24 hours: Temp:  [98.3 F (36.8 C)-98.6 F (37 C)] 98.6 F (37 C) (10/27 0530) Pulse Rate:  [105-108] 105  (10/27 0530) Resp:  [16] 16  (10/27 0733) BP: (117-134)/(73-78) 134/78 mmHg (10/27 0530) SpO2:  [98 %-100 %] 98 % (10/27 0733)  Intake/Output from previous day: 10/26 0701 - 10/27 0700 In: 780 [P.O.:780] Out: 1075 [Urine:1075] Intake/Output this shift:     Basename 04/02/12 0540 04/01/12 0425  HGB 9.3* 9.7*    Basename 04/02/12 0540 04/01/12 0425  WBC 9.5 8.6  RBC 3.23* 3.36*  HCT 26.4* 27.4*  PLT 237 270    Basename 04/02/12 0540 04/01/12 0425  NA 137 133*  K 3.5 3.8  CL 101 99  CO2 27 24  BUN 4* 7  CREATININE 0.51 0.63  GLUCOSE 221* 254*  CALCIUM 8.9 8.7    Basename 04/03/12 0439 04/02/12 0540  LABPT -- --  INR 2.20* 3.57*    No cellulitis present  Assessment/Plan: DC to [facility] [place] Monday.   Cassandra Ortega A 04/03/2012, 7:51 AM

## 2012-04-04 LAB — GLUCOSE, CAPILLARY
Glucose-Capillary: 223 mg/dL — ABNORMAL HIGH (ref 70–99)
Glucose-Capillary: 188 mg/dL — ABNORMAL HIGH (ref 70–99)
Glucose-Capillary: 222 mg/dL — ABNORMAL HIGH (ref 70–99)

## 2012-04-04 LAB — PROTIME-INR: INR: 1.54 — ABNORMAL HIGH (ref 0.00–1.49)

## 2012-04-04 MED ORDER — BISACODYL 10 MG RE SUPP: 10.0000 mg | Freq: Every day | RECTAL | Status: AC | PRN

## 2012-04-04 MED ORDER — WARFARIN SODIUM 1 MG PO TABS
1.0000 mg | ORAL_TABLET | Freq: Once | ORAL | Status: DC
  Filled 2012-04-04: qty 1

## 2012-04-04 MED ORDER — DSS 100 MG PO CAPS: 100.0000 mg | ORAL_CAPSULE | Freq: Two times a day (BID) | ORAL | Status: AC

## 2012-04-04 MED ORDER — METOCLOPRAMIDE HCL 5 MG PO TABS: 5.0000 mg | ORAL_TABLET | Freq: Three times a day (TID) | ORAL | Status: AC | PRN

## 2012-04-04 MED ORDER — HYDROMORPHONE HCL 2 MG PO TABS: 2.0000 mg | ORAL_TABLET | ORAL | Status: AC | PRN

## 2012-04-04 MED ORDER — CYCLOBENZAPRINE HCL 10 MG PO TABS: 10.0000 mg | ORAL_TABLET | Freq: Three times a day (TID) | ORAL | Status: AC | PRN

## 2012-04-04 MED ORDER — WARFARIN SODIUM 1 MG PO TABS: 1.0000 mg | ORAL_TABLET | Freq: Once | ORAL | Status: AC

## 2012-04-04 MED ORDER — WARFARIN SODIUM 2 MG PO TABS
2.0000 mg | ORAL_TABLET | Freq: Once | ORAL | Status: AC
  Administered 2012-04-04: 2 mg via ORAL
  Filled 2012-04-04: qty 1

## 2012-04-04 MED ORDER — POLYSACCHARIDE IRON COMPLEX 150 MG PO CAPS: 150.0000 mg | ORAL_CAPSULE | Freq: Every day | ORAL | Status: AC

## 2012-04-04 NOTE — Progress Notes (Signed)
CSW assisting with d/c planning. [facility] contacted . SNF is waiting to hear back from [facility] regarding rate negotiation. CSW has called [employer] and message left requesting a return call for assistance with d/c planning. Pt has been updated. CSW will continue to follow to assist with d/c planning to SNF. D/C Summary has been sent to [facility] in preporation for today's d/c.  Cori Razor LCSW [phone]

## 2012-04-04 NOTE — Discharge Summary (Signed)
Physician Discharge Summary   Patient ID: Cassandra Ortega MRN: [id number] DOB/AGE: 06/28/1955 56 y.o.  Admit date: 03/30/2012 Discharge date: 04/04/2012  Primary Diagnosis: Osteoarthritis Bilateral knee(s)   Admission Diagnoses:  Past Medical History  Diagnosis Date  . Diabetes mellitus without complication 03-24-12    NIDDM x 15 yrs-oral meds  . H/O hiatal hernia   . GERD (gastroesophageal reflux disease) 03-24-12    OTC meds used as needed  . Hemorrhoids 03-24-12    not presently a problem  . Arthritis 03-24-12    osteoarthritis. Rt. knee cap shattered. Left femur and tibia fx-no hardware.  . No pertinent past medical history 03-24-12    "Flu Shot and Whooping Cough,Tetanus" done 03-23-12   Discharge Diagnoses:   Principal Problem:  *OA (osteoarthritis) of knee Active Problems:  Postop Acute blood loss anemia  Postop Hyponatremia  Estimated Body mass index is 31.66 kg/(m^2) as calculated from the following:   Height as of this encounter: 5\' 0" (1.524 m).   Weight as of this encounter: 162 lb 2 oz(73.539 kg).  Classification of overweight in adults according to BMI (WHO, 1998)   Procedure:  Procedure(s) (LRB): TOTAL KNEE BILATERAL (Bilateral)   Consults: [facility] and Anesthesia for epidural management  HPI: Cassandra Ortega is a 56 y.o. 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 She has constant pain, with activity and at rest and significant functional deficits with difficulties even with ADLs. She has had extensive non-op management including analgesics, injections of cortisone and home exercise program, but remains in significant pain with significant dysfunction. Radiographs show bone on bone arthritis medial and patellofemoral on left and bone on bone medial on right. She is s/p patellectomy on the right. She was given the option of staged vs. Simultaneous Total knee arthroplasty and chose to do both  in one setting after discussing the procedure, risks potential complications and rehab course associated with both options.She presents now for bilateralTotal Knee Arthroplasty.   Laboratory Data: Admission on 03/30/2012  Component Date Value Range Status  . ABO/RH(D) 03/30/2012 B POS   Final  . Antibody Screen 03/30/2012 NEG   Final  . Sample Expiration 03/30/2012 04/02/2012   Final  . Glucose-Capillary 03/30/2012 153* 70 - 99 mg/dL Final  . Comment 1 40/98/1191 Documented in Chart   Final  . ABO/RH(D) 03/30/2012 B POS   Final  . Glucose-Capillary 03/30/2012 184* 70 - 99 mg/dL Final  . Comment 1 47/82/9562 Documented in Chart   Final  . Comment 2 03/30/2012 Notify RN   Final  . WBC 03/31/2012 10.1  4.0 - 10.5 K/uL Final  . RBC 03/31/2012 4.02  3.87 - 5.11 MIL/uL Final  . Hemoglobin 03/31/2012 11.4* 12.0 - 15.0 g/dL Final  . HCT 13/01/6577 32.0* 36.0 - 46.0 % Final  . MCV 03/31/2012 79.6  78.0 - 100.0 fL Final  . MCH 03/31/2012 28.4  26.0 - 34.0 pg Final  . MCHC 03/31/2012 35.6  30.0 - 36.0 g/dL Final  . RDW 46/96/2952 12.2  11.5 - 15.5 % Final  . Platelets 03/31/2012 258  150 - 400 K/uL Final  . Sodium 03/31/2012 133* 135 - 145 mEq/L Final  . Potassium 03/31/2012 4.4  3.5 - 5.1 mEq/L Final  . Chloride 03/31/2012 98  96 - 112 mEq/L Final  . CO2 03/31/2012 22  19 - 32 mEq/L Final  . Glucose, Bld 03/31/2012 316* 70 - 99 mg/dL Final  . BUN  03/31/2012 11  6 - 23 mg/dL Final  . Creatinine, Ser 03/31/2012 0.58  0.50 - 1.10 mg/dL Final  . Calcium 40/98/1191 8.6  8.4 - 10.5 mg/dL Final  . GFR calc non Af Amer 03/31/2012 >90  >90 mL/min Final  . GFR calc Af Amer 03/31/2012 >90  >90 mL/min Final   Comment:                                 The eGFR has been calculated                          using the CKD EPI equation.                          This calculation has not been                          validated in all clinical                          situations.                           eGFR's persistently                          <90 mL/min signify                          possible Chronic Kidney Disease.  Marland Kitchen Prothrombin Time 03/31/2012 14.5  11.6 - 15.2 seconds Final  . INR 03/31/2012 1.15  0.00 - 1.49 Final  . Glucose-Capillary 03/30/2012 317* 70 - 99 mg/dL Final  . Glucose-Capillary 03/31/2012 250* 70 - 99 mg/dL Final  . Glucose-Capillary 03/31/2012 228* 70 - 99 mg/dL Final  . WBC 47/82/9562 8.6  4.0 - 10.5 K/uL Final  . RBC 04/01/2012 3.36* 3.87 - 5.11 MIL/uL Final  . Hemoglobin 04/01/2012 9.7* 12.0 - 15.0 g/dL Final  . HCT 13/01/6577 27.4* 36.0 - 46.0 % Final  . MCV 04/01/2012 81.5  78.0 - 100.0 fL Final  . MCH 04/01/2012 28.9  26.0 - 34.0 pg Final  . MCHC 04/01/2012 35.4  30.0 - 36.0 g/dL Final  . RDW 46/96/2952 13.0  11.5 - 15.5 % Final  . Platelets 04/01/2012 270  150 - 400 K/uL Final  . Sodium 04/01/2012 133* 135 - 145 mEq/L Final  . Potassium 04/01/2012 3.8  3.5 - 5.1 mEq/L Final  . Chloride 04/01/2012 99  96 - 112 mEq/L Final  . CO2 04/01/2012 24  19 - 32 mEq/L Final  . Glucose, Bld 04/01/2012 254* 70 - 99 mg/dL Final  . BUN 84/13/2440 7  6 - 23 mg/dL Final  . Creatinine, Ser 04/01/2012 0.63  0.50 - 1.10 mg/dL Final  . Calcium 04/04/2535 8.7  8.4 - 10.5 mg/dL Final  . GFR calc non Af Amer 04/01/2012 >90  >90 mL/min Final  . GFR calc Af Amer 04/01/2012 >90  >90 mL/min Final   Comment:                                 The eGFR has been calculated  using the CKD EPI equation.                          This calculation has not been                          validated in all clinical                          situations.                          eGFR's persistently                          <90 mL/min signify                          possible Chronic Kidney Disease.  Marland Kitchen Prothrombin Time 04/01/2012 15.9* 11.6 - 15.2 seconds Final  . INR 04/01/2012 1.30  0.00 - 1.49 Final  . Glucose-Capillary 03/31/2012 189* 70 - 99 mg/dL Final  .  Glucose-Capillary 03/31/2012 228* 70 - 99 mg/dL Final  . Glucose-Capillary 04/01/2012 214* 70 - 99 mg/dL Final  . Glucose-Capillary 04/01/2012 179* 70 - 99 mg/dL Final  . Glucose-Capillary 04/01/2012 205* 70 - 99 mg/dL Final  . WBC 21/30/8657 9.5  4.0 - 10.5 K/uL Final  . RBC 04/02/2012 3.23* 3.87 - 5.11 MIL/uL Final  . Hemoglobin 04/02/2012 9.3* 12.0 - 15.0 g/dL Final  . HCT 84/69/6295 26.4* 36.0 - 46.0 % Final  . MCV 04/02/2012 81.7  78.0 - 100.0 fL Final  . MCH 04/02/2012 28.8  26.0 - 34.0 pg Final  . MCHC 04/02/2012 35.2  30.0 - 36.0 g/dL Final  . RDW 28/41/3244 12.9  11.5 - 15.5 % Final  . Platelets 04/02/2012 237  150 - 400 K/uL Final  . Prothrombin Time 04/02/2012 33.6* 11.6 - 15.2 seconds Final  . INR 04/02/2012 3.57* 0.00 - 1.49 Final  . Sodium 04/02/2012 137  135 - 145 mEq/L Final  . Potassium 04/02/2012 3.5  3.5 - 5.1 mEq/L Final  . Chloride 04/02/2012 101  96 - 112 mEq/L Final  . CO2 04/02/2012 27  19 - 32 mEq/L Final  . Glucose, Bld 04/02/2012 221* 70 - 99 mg/dL Final  . BUN 06/10/7251 4* 6 - 23 mg/dL Final  . Creatinine, Ser 04/02/2012 0.51  0.50 - 1.10 mg/dL Final  . Calcium 66/44/0347 8.9  8.4 - 10.5 mg/dL Final  . GFR calc non Af Amer 04/02/2012 >90  >90 mL/min Final  . GFR calc Af Amer 04/02/2012 >90  >90 mL/min Final   Comment:                                 The eGFR has been calculated                          using the CKD EPI equation.                          This calculation has not been  validated in all clinical                          situations.                          eGFR's persistently                          <90 mL/min signify                          possible Chronic Kidney Disease.  . Glucose-Capillary 04/01/2012 183* 70 - 99 mg/dL Final  . Comment 1 04/54/0981 Notify RN   Final  . Glucose-Capillary 04/02/2012 184* 70 - 99 mg/dL Final  . Glucose-Capillary 04/02/2012 175* 70 - 99 mg/dL Final  . Prothrombin Time  04/03/2012 23.5* 11.6 - 15.2 seconds Final  . INR 04/03/2012 2.20* 0.00 - 1.49 Final  . Glucose-Capillary 04/02/2012 172* 70 - 99 mg/dL Final  . Glucose-Capillary 04/02/2012 149* 70 - 99 mg/dL Final  . Comment 1 19/14/7829 Notify RN   Final  . Comment 2 04/02/2012 Documented in Chart   Final  . Glucose-Capillary 04/03/2012 181* 70 - 99 mg/dL Final  . Glucose-Capillary 04/03/2012 175* 70 - 99 mg/dL Final  . Prothrombin Time 04/04/2012 18.0* 11.6 - 15.2 seconds Final  . INR 04/04/2012 1.54* 0.00 - 1.49 Final  . Glucose-Capillary 04/03/2012 231* 70 - 99 mg/dL Final  . Comment 1 56/21/3086 Documented in Chart   Final  . Comment 2 04/03/2012 Notify RN   Final  . Glucose-Capillary 04/03/2012 191* 70 - 99 mg/dL Final  . Comment 1 57/84/6962 Documented in Chart   Final  . Comment 2 04/03/2012 Notify RN   Final  Hospital Outpatient Visit on 03/24/2012  Component Date Value Range Status  . MRSA, PCR 03/24/2012 NEGATIVE  NEGATIVE Final  . Staphylococcus aureus 03/24/2012 NEGATIVE  NEGATIVE Final   Comment:                                 The Xpert SA Assay (FDA                          approved for NASAL specimens                          in patients over 69 years of age),                          is one component of                          a comprehensive surveillance                          program.  Test performance has                          been validated by [employer] for patients greater  than or equal to 47 year old.                          It is not intended                          to diagnose infection nor to                          guide or monitor treatment.  Marland Kitchen aPTT 03/24/2012 31  24 - 37 seconds Final  . WBC 03/24/2012 12.4* 4.0 - 10.5 K/uL Final  . RBC 03/24/2012 4.92  3.87 - 5.11 MIL/uL Final  . Hemoglobin 03/24/2012 14.1  12.0 - 15.0 g/dL Final  . HCT 16/03/9603 39.6  36.0 - 46.0 % Final  . MCV 03/24/2012 80.5  78.0 -  100.0 fL Final  . MCH 03/24/2012 28.7  26.0 - 34.0 pg Final  . MCHC 03/24/2012 35.6  30.0 - 36.0 g/dL Final  . RDW 54/02/8118 12.3  11.5 - 15.5 % Final  . Platelets 03/24/2012 219  150 - 400 K/uL Final  . Sodium 03/24/2012 133* 135 - 145 mEq/L Final  . Potassium 03/24/2012 4.0  3.5 - 5.1 mEq/L Final  . Chloride 03/24/2012 96  96 - 112 mEq/L Final  . CO2 03/24/2012 26  19 - 32 mEq/L Final  . Glucose, Bld 03/24/2012 245* 70 - 99 mg/dL Final  . BUN 14/78/2956 9  6 - 23 mg/dL Final  . Creatinine, Ser 03/24/2012 0.64  0.50 - 1.10 mg/dL Final  . Calcium 21/30/8657 10.1  8.4 - 10.5 mg/dL Final  . Total Protein 03/24/2012 7.1  6.0 - 8.3 g/dL Final  . Albumin 84/69/6295 3.9  3.5 - 5.2 g/dL Final  . AST 28/41/3244 14  0 - 37 U/L Final  . ALT 03/24/2012 14  0 - 35 U/L Final  . Alkaline Phosphatase 03/24/2012 84  39 - 117 U/L Final  . Total Bilirubin 03/24/2012 0.6  0.3 - 1.2 mg/dL Final  . GFR calc non Af Amer 03/24/2012 >90  >90 mL/min Final  . GFR calc Af Amer 03/24/2012 >90  >90 mL/min Final   Comment:                                 The eGFR has been calculated                          using the CKD EPI equation.                          This calculation has not been                          validated in all clinical                          situations.                          eGFR's persistently                          <90 mL/min signify  possible Chronic Kidney Disease.  Marland Kitchen Prothrombin Time 03/24/2012 13.1  11.6 - 15.2 seconds Final  . INR 03/24/2012 1.00  0.00 - 1.49 Final  . Color, Urine 03/24/2012 YELLOW  YELLOW Final  . APPearance 03/24/2012 CLEAR  CLEAR Final  . Specific Gravity, Urine 03/24/2012 1.026  1.005 - 1.030 Final  . pH 03/24/2012 8.0  5.0 - 8.0 Final  . Glucose, UA 03/24/2012 100* NEGATIVE mg/dL Final  . Hgb urine dipstick 03/24/2012 NEGATIVE  NEGATIVE Final  . Bilirubin Urine 03/24/2012 NEGATIVE  NEGATIVE Final  . Ketones, ur 03/24/2012 15*  NEGATIVE mg/dL Final  . Protein, ur 40/98/1191 NEGATIVE  NEGATIVE mg/dL Final  . Urobilinogen, UA 03/24/2012 1.0  0.0 - 1.0 mg/dL Final  . Nitrite 47/82/9562 NEGATIVE  NEGATIVE Final  . Leukocytes, UA 03/24/2012 NEGATIVE  NEGATIVE Final   MICROSCOPIC NOT DONE ON URINES WITH NEGATIVE PROTEIN, BLOOD, LEUKOCYTES, NITRITE, OR GLUCOSE <1000 mg/dL.     X-Rays:No results found.  EKG: Orders placed during the hospital encounter of 03/30/12  . EKG     Hospital Course: Patient was admitted to [facility] and taken to the OR and underwent the above stated procedure well without complications.  Patient tolerated the procedure well and was later transferred to the recovery room and then to the orthopaedic floor for postoperative care. Anesthesia was consulted postoperatively to place an epidural in for postoperative pain management. The patient was also given PO and IV analgesics for pain control following their surgery.  They were given 24 hours of postoperative antibiotics and started on DVT prophylaxis in the form of Lovenox and Coumadin after the epidural had been removed.   PT and OT were ordered for total joint protocol.  Discharge planning consulted to help with postop disposition and equipment needs.  Patient had a good night on the evening of surgery except for some nausea but started to get up OOB with therapy on day one.  PCA Morphine was continued for the first couple of days until the epidural was removed.  Hemovac drains were pulled without difficulty on day one.  [facility] Inpatient Rehab consult was ordered to see if patient would benefit from CIR. Continued to work with therapy into day two.  Dressings were changed on day two and both incisions were healing well.  She had an increase in her pain thru the night. Changed her meds for better control and the fact that she continued to have some mild nausea.  The epidural was removed with difficulty by Anesthesia on day two. Continue PCA for  another day. Anticipated possible increase in pain temporarily after the epidural had been removed. Switch to PCA Dilaudid for better control. Gave another round of IV Ofirmev due to her nausea and increase in pain. By day three, pain was much better than the day before. Nausea had resolved. The patient started to show progress with therapy getting up and walking over 60 feet.  They continued to receive therapy each day for continued total knee protocol.  The incisions were healing well.  She decided against Inpatient Rehab at [facility] and wanted to go to one of the SNF.  Social work had been involved following the surgery just in case CIR was not an option.  They continued to progress on day four and day five at which time the patient was seen in rounds by Dr. Lequita Halt and was ready to go the SNF.   Discharge Medications: Prior to Admission medications   Medication Sig Start Date  End Date Taking? Authorizing Provider  acetaminophen (TYLENOL) 500 MG tablet Take 500 mg by mouth every 6 (six) hours as needed. For pain   Yes Historical Provider, MD  buPROPion (WELLBUTRIN SR) 150 MG 12 hr tablet Take 150 mg by mouth every morning.   Yes Historical Provider, MD  metFORMIN (GLUCOPHAGE) 500 MG tablet Take 500 mg by mouth 2 (two) times daily with a meal.   Yes Historical Provider, MD  bisacodyl (DULCOLAX) 10 MG suppository Place 1 suppository (10 mg total) rectally daily as needed. 04/04/12   Alexzandrew Julien Girt, PA  cyclobenzaprine (FLEXERIL) 10 MG tablet Take 1 tablet (10 mg total) by mouth 3 (three) times daily as needed for muscle spasms. 04/04/12   Alexzandrew Perkins, PA  docusate sodium 100 MG CAPS Take 100 mg by mouth 2 (two) times daily. 04/04/12   Alexzandrew Perkins, PA  HYDROmorphone (DILAUDID) 2 MG tablet Take 1-2 tablets (2-4 mg total) by mouth every 4 (four) hours as needed. 04/04/12   Alexzandrew Julien Girt, PA  iron polysaccharides (NIFEREX) 150 MG capsule Take 1 capsule (150 mg total) by mouth daily.  04/04/12   Alexzandrew Julien Girt, PA  metoCLOPramide (REGLAN) 5 MG tablet Take 1 tablet (5 mg total) by mouth every 8 (eight) hours as needed (if ondansetron (ZOFRAN) ineffective.). 04/04/12   Alexzandrew Julien Girt, PA  warfarin (COUMADIN) 1 MG tablet Take 1 tablet (1 mg total) by mouth one time only at 6 PM. Take Coumadin for four weeks and then discontinue.  The dose may need to be adjusted based upon the INR.  Please follow the INR and titrate Coumadin dose for a therapeutic range between 2.0 and 3.0 INR.  After completing the three weeks of Coumadin, the patient may stop the Coumadin and then take  81 mg Aspirin daily for four more weeks. 04/04/12   Alexzandrew Julien Girt, PA    Diet: Diabetic diet Activity:WBAT Follow-up:in 2 weeks Disposition - Skilled nursing facility - [facility] Discharged Condition: good   Discharge Orders    Future Orders Please Complete By Expires   Diet Carb Modified      Call MD / Call 911      Comments:   If you experience chest pain or shortness of breath, CALL 911 and be transported to the hospital emergency room.  If you develope a fever above 101 F, pus (white drainage) or increased drainage or redness at the wound, or calf pain, call your surgeon's office.   Discharge instructions      Comments:   Pick up stool softner and laxative for home. Do not submerge incision under water. May shower. Continue to use ice for pain and swelling from surgery.  Take Coumadin for four weeks and then discontinue.  The dose may need to be adjusted based upon the INR.  Please follow the INR and titrate Coumadin dose for a therapeutic range between 2.0 and 3.0 INR.  After completing the four weeks of Coumadin, the patient may stop the Coumadin and then take 81 mg Aspirin daily for four more weeks.  When discharged from the skilled rehab facility, please have the facility set up the patient's Home Health Physical Therapy prior to being released.  Also provide the patient with  their medications at time of release from the facility to include their pain medication, the muscle relaxants, and their blood thinner medication.  If the patient is still at the rehab facility at time of follow up appointment, please also assist the patient in arranging follow up  appointment in our office and any transportation needs.   Constipation Prevention      Comments:   Drink plenty of fluids.  Prune juice may be helpful.  You may use a stool softener, such as Colace (over the counter) 100 mg twice a day.  Use MiraLax (over the counter) for constipation as needed.   Increase activity slowly as tolerated      Patient may shower      Comments:   You may shower without a dressing once there is no drainage.  Do not wash over the wound.  If drainage remains, do not shower until drainage stops.   Weight bearing as tolerated      Driving restrictions      Comments:   No driving until released by the physician.   Lifting restrictions      Comments:   No lifting until released by the physician.   TED hose      Comments:   Use stockings (TED hose) for 3 weeks on both leg(s).  You may remove them at night for sleeping.   Change dressing      Comments:   Change dressing daily with sterile 4 x 4 inch gauze dressing and apply TED hose. Do not submerge the incision under water.   Do not put a pillow under the knee. Place it under the heel.      Do not sit on low chairs, stoools or toilet seats, as it may be difficult to get up from low surfaces          Medication List     As of 04/04/2012  7:51 AM    STOP taking these medications         estradiol 25 MCG vaginal tablet   Commonly known as: VAGIFEM      TAKE these medications         acetaminophen 500 MG tablet   Commonly known as: TYLENOL   Take 500 mg by mouth every 6 (six) hours as needed. For pain      bisacodyl 10 MG suppository   Commonly known as: DULCOLAX   Place 1 suppository (10 mg total) rectally daily as needed.       buPROPion 150 MG 12 hr tablet   Commonly known as: WELLBUTRIN SR   Take 150 mg by mouth every morning.      cyclobenzaprine 10 MG tablet   Commonly known as: FLEXERIL   Take 1 tablet (10 mg total) by mouth 3 (three) times daily as needed for muscle spasms.      DSS 100 MG Caps   Take 100 mg by mouth 2 (two) times daily.      HYDROmorphone 2 MG tablet   Commonly known as: DILAUDID   Take 1-2 tablets (2-4 mg total) by mouth every 4 (four) hours as needed.      iron polysaccharides 150 MG capsule   Commonly known as: NIFEREX   Take 1 capsule (150 mg total) by mouth daily.      metFORMIN 500 MG tablet   Commonly known as: GLUCOPHAGE   Take 500 mg by mouth 2 (two) times daily with a meal.      metoCLOPramide 5 MG tablet   Commonly known as: REGLAN   Take 1 tablet (5 mg total) by mouth every 8 (eight) hours as needed (if ondansetron (ZOFRAN) ineffective.).      warfarin 1 MG tablet   Commonly known as: COUMADIN   Take 1  tablet (1 mg total) by mouth one time only at 6 PM. Take Coumadin for four weeks and then discontinue.  The dose may need to be adjusted based upon the INR.  Please follow the INR and titrate Coumadin dose for a therapeutic range between 2.0 and 3.0 INR.  After completing the three weeks of Coumadin, the patient may stop the Coumadin and then take  81 mg Aspirin daily for four more weeks.           Follow-up Information    Follow up with Loanne Drilling, MD. Schedule an appointment as soon as possible for a visit in 2 weeks.   Contact information:   [address] [place] [address] [phone]          Signed: Patrica Duel 04/04/2012, 7:51 AM

## 2012-04-04 NOTE — Progress Notes (Signed)
   Subjective: 5 Days Post-Op Procedure(s) (LRB): TOTAL KNEE BILATERAL (Bilateral) Patient reports pain as mild.   Patient seen in rounds by Dr. Lequita Halt. Patient is well, and has had no acute complaints or problems Patient is ready to go to the SNF today.  Objective: Vital signs in last 24 hours: Temp:  [98.4 F (36.9 C)-98.8 F (37.1 C)] 98.4 F (36.9 C) (10/28 0458) Pulse Rate:  [106-111] 106  (10/28 0458) Resp:  [12-16] 16  (10/28 0458) BP: (120-135)/(76-81) 128/81 mmHg (10/28 0458) SpO2:  [97 %-100 %] 97 % (10/28 0458)  Intake/Output from previous day:  Intake/Output Summary (Last 24 hours) at 04/04/12 0736 Last data filed at 04/04/12 0458  Gross per 24 hour  Intake   1200 ml  Output      0 ml  Net   1200 ml    Labs:  Basename 04/02/12 0540  HGB 9.3*    Basename 04/02/12 0540  WBC 9.5  RBC 3.23*  HCT 26.4*  PLT 237    Basename 04/02/12 0540  NA 137  K 3.5  CL 101  CO2 27  BUN 4*  CREATININE 0.51  GLUCOSE 221*  CALCIUM 8.9    Basename 04/04/12 0435 04/03/12 0439  LABPT -- --  INR 1.54* 2.20*    EXAM: General - Patient is Alert, Appropriate and Oriented Extremity - Neurovascular intact Sensation intact distally Dorsiflexion/Plantar flexion intact No cellulitis present Incision - clean, dry, no drainage, healing Motor Function - intact, moving foot and toes well on exam.   Assessment/Plan: 5 Days Post-Op Procedure(s) (LRB): TOTAL KNEE BILATERAL (Bilateral) Procedure(s) (LRB): TOTAL KNEE BILATERAL (Bilateral) Past Medical History  Diagnosis Date  . Diabetes mellitus without complication 03-24-12    NIDDM x 15 yrs-oral meds  . H/O hiatal hernia   . GERD (gastroesophageal reflux disease) 03-24-12    OTC meds used as needed  . Hemorrhoids 03-24-12    not presently a problem  . Arthritis 03-24-12    osteoarthritis. Rt. knee cap shattered. Left femur and tibia fx-no hardware.  . No pertinent past medical history 03-24-12    "Flu Shot and  Whooping Cough,Tetanus" done 03-23-12   Principal Problem:  *OA (osteoarthritis) of knee Active Problems:  Postop Acute blood loss anemia  Postop Hyponatremia  Estimated Body mass index is 31.66 kg/(m^2) as calculated from the following:   Height as of this encounter: 5\' 0" (1.524 m).   Weight as of this encounter: 162 lb 2 oz(73.539 kg). Discharge to SNF Diet - Diabetic diet Follow up - in 2 weeks following the surgery Activity - WBAT to both legs Disposition - Skilled nursing facility Condition Upon Discharge - Good D/C Meds - See DC Summary DVT Prophylaxis - Coumadin  Gaelyn Tukes 04/04/2012, 7:36 AM

## 2012-04-04 NOTE — Progress Notes (Signed)
SNF contacted to check status of [employer] rate negotiations.  [employer] has failed to return calls from SNF or CSW. [employer] is aware pt is ready for d/c today but has failed to complete negotiations with [facility] so she can be d/c. to SNF. CSW will contact SNF / [employer] in am to continue with d/c planning. Pt has been updated. Nsg will update MD.  Cori Razor LCSW [phone]

## 2012-04-04 NOTE — Progress Notes (Signed)
Physical Therapy Treatment Patient Details Name: Himani Corona MRN: [id number] DOB: 05/07/1956 Today's Date: 04/04/2012 Time: 6644-0347 PT Time Calculation (min): 31 min  PT Assessment / Plan / Recommendation Comments on Treatment Session  POD #5 B TKR pm session.  Pt just amb to and from bathroom with max c/o fatigue so performed TKR TE's.    Follow Up Recommendations   (skilled nursing)     Does the patient have the potential to tolerate intense rehabilitation     Barriers to Discharge        Equipment Recommendations       Recommendations for Other Services    Frequency 7X/week   Plan Discharge plan remains appropriate    Precautions / Restrictions     Pertinent Vitals/Pain C/o 8/10 B knee pain after TE's ICE applied       Exercises Total Joint Exercises Ankle Circles/Pumps: AROM;Both;10 reps;Supine Quad Sets: AROM;Both;10 reps;Supine Gluteal Sets: AROM;Both;10 reps;Supine Towel Squeeze: AROM;Both;10 reps;Supine Heel Slides: AAROM;Both;10 reps;Supine Hip ABduction/ADduction: AROM;Both;10 reps;Supine Straight Leg Raises: AAROM;Both;10 reps;Supine    PT Goals              progressing    Visit Information  Last PT Received On: 04/04/12 Assistance Needed: +1    Subjective Data  Subjective: I took as shower this morning and have been up since 4am, I'm tiered. Patient Stated Goal: right now, go back to bed and rest             End of Session PT - End of Session Equipment Utilized During Treatment: Gait belt Activity Tolerance: Patient tolerated treatment well Patient left: with call bell/phone within reach;in bed   Felecia Shelling  PTA [facility]  Acute  Rehab Pager     [phone]

## 2012-04-04 NOTE — Progress Notes (Signed)
Physical Therapy Treatment Patient Details Name: Cassandra Ortega MRN: [id number] DOB: 22-Sep-1955 Today's Date: 04/04/2012 Time: 4098-1191 PT Time Calculation (min): 12 min  PT Assessment / Plan / Recommendation Comments on Treatment Session  Pt very fatigued from am shower and being woke up at 4am.  Assisted pt out of recliner and back to bed per pt request to sleep and take pain meds.  Pt plans to D/C to SNF for ST Rehab.     Follow Up Recommendations   (skilled nursing)     Does the patient have the potential to tolerate intense rehabilitation     Barriers to Discharge        Equipment Recommendations       Recommendations for Other Services    Frequency 7X/week   Plan Discharge plan remains appropriate    Precautions / Restrictions     Pertinent Vitals/Pain C/o 8/10 B LE Pain meds given ICE applied   Mobility  Bed Mobility Bed Mobility: Sit to Supine Sit to Supine: 3: Mod assist Details for Bed Mobility Assistance: assist B LE up into bed and increased time  Transfers Transfers: Sit to Stand;Stand to Sit Sit to Stand: 3: Mod assist;From chair/3-in-1 Stand to Sit: 4: Min assist;To bed Details for Transfer Assistance: increased time and <25% VC's on sasfety with turns and backward gait to bed.  Ambulation/Gait Ambulation/Gait Assistance: 4: Min assist Ambulation Distance (Feet): 3 Feet Assistive device: Rolling walker Ambulation/Gait Assistance Details: decreased amb distance 2nd increased c/o fatigue and pain.  Just now time for meds.  B ICE packs applied. Gait Pattern: Wide base of support;Trunk flexed;Step-to pattern Gait velocity: decreased    PT Goals progressing    Visit Information  Last PT Received On: 04/04/12 Assistance Needed: +1    Subjective Data  Subjective: I took as shower this morning and have been up since 4am, I'm tiered. Patient Stated Goal: right now, go back to bed and rest   Cognition    good   Balance   fair  End of Session PT -  End of Session Equipment Utilized During Treatment: Gait belt Activity Tolerance: Patient limited by pain Patient left: in bed;with call bell/phone within reach  Felecia Shelling  PTA [facility]  Acute  Rehab Pager     [phone]

## 2012-04-04 NOTE — Progress Notes (Signed)
ANTICOAGULATION CONSULT NOTE   Pharmacy Consult for Coumadin Indication: VTE prophylaxis  Allergies  Allergen Reactions  . Ivp Dye (Iodinated Diagnostic Agents) Hives, Swelling and Other (See Comments)    Sneezing     Patient Measurements: Height: 5' (152.4 cm) Weight: 162 lb 2 oz (73.539 kg) IBW/kg (Calculated) : 45.5   Labs:  Basename 04/04/12 0435 04/03/12 0439 04/02/12 0540  HGB -- -- 9.3*  HCT -- -- 26.4*  PLT -- -- 237  APTT -- -- --  LABPROT 18.0* 23.5* 33.6*  INR 1.54* 2.20* 3.57*  HEPARINUNFRC -- -- --  CREATININE -- -- 0.51  CKTOTAL -- -- --  CKMB -- -- --  TROPONINI -- -- --    Estimated Creatinine Clearance: 69.4 ml/min (by C-G formula based on Cr of 0.51).  Inpatient warfarin doses administered 10/24 - 10/27:  5, 5, 0, 1 mg  Assessment:  56 yo F s/p bilateral TKA 10/23.  Coumadin started 10/24 for VTE prophylaxis with plans for epidural removal 10/24 and Lovenox to start 12 hours s/p epidural removal.   Lovenox started 10/25 PM but was discontinued 10/26 d/t high INR.  INR up sharply and supratherapeutic 10/26 after warfarin 5 mg daily x 2 doses.  INR therapeutic 10/27 after holding warfarin.   INR subtherapeutic today after warfarin 1 mg, but full effect of yesterday's dose not yet apparent. Ortho has ordered warfarin 1 mg again for tonight.    No bleeding reported.  Plans for discharge to SNF today noted.  Pharmacy provided Coumadin education 10/25.  Goal of Therapy:  INR 2-3    Plan:   Warfarin 1 mg po x1 tonight at SNF as ordered by ortho.  Recommend recheck INR tomorrow, then 3 times per week at SNF.  Patient may need slight increase in warfarin dosage to maintain INR 2-3.  Elie Goody, PharmD, BCPS Pager: [phone] 04/04/2012  8:04 AM

## 2012-04-04 NOTE — Progress Notes (Addendum)
Coumadin Consult   Please see AM note by Electa Sniff (PharmD) for more details.  Plan for discharge canceled and RN called for Coumadin dose for tonight.    Plan: Coumadin 2mg  po x1 tonight.  Pharmacy will f/u in AM  [facility], PharmD, BCPS Pager: [phone] 5:25 PM Pharmacy #: [id number]

## 2012-04-05 LAB — GLUCOSE, CAPILLARY: Glucose-Capillary: 237 mg/dL — ABNORMAL HIGH (ref 70–99)

## 2012-04-05 LAB — PROTIME-INR: INR: 1.44 (ref 0.00–1.49)

## 2012-04-05 MED ORDER — WARFARIN SODIUM 2.5 MG PO TABS
2.5000 mg | ORAL_TABLET | Freq: Every day | ORAL | Status: DC
  Filled 2012-04-05: qty 1

## 2012-04-05 MED ORDER — ENOXAPARIN SODIUM 30 MG/0.3ML ~~LOC~~ SOLN
30.0000 mg | Freq: Two times a day (BID) | SUBCUTANEOUS | Status: DC
  Administered 2012-04-05: 30 mg via SUBCUTANEOUS
  Filled 2012-04-05 (×2): qty 0.3

## 2012-04-05 NOTE — Progress Notes (Signed)
Discharge summary sent to payer through [facility]  

## 2012-04-05 NOTE — Progress Notes (Signed)
CSW spoke with [facility] earlier this am. Awaiting decision from [facility]. Will continue to follow to assist with d/c planning to SNF.  Cori Razor LCSW [phone]

## 2012-04-05 NOTE — Progress Notes (Signed)
ANTICOAGULATION CONSULT NOTE   Pharmacy Consult for Coumadin Indication: VTE prophylaxis  Allergies  Allergen Reactions  . Ivp Dye (Iodinated Diagnostic Agents) Hives, Swelling and Other (See Comments)    Sneezing     Patient Measurements: Height: 5' (152.4 cm) Weight: 162 lb 2 oz (73.539 kg) IBW/kg (Calculated) : 45.5   Labs:  Basename 04/05/12 0430 04/04/12 0435 04/03/12 0439  HGB -- -- --  HCT -- -- --  PLT -- -- --  APTT -- -- --  LABPROT 17.2* 18.0* 23.5*  INR 1.44 1.54* 2.20*  HEPARINUNFRC -- -- --  CREATININE -- -- --  CKTOTAL -- -- --  CKMB -- -- --  TROPONINI -- -- --    Estimated Creatinine Clearance: 69.4 ml/min (by C-G formula based on Cr of 0.51).  Inpatient warfarin doses administered 10/24 - 10/28:  5, 5, 0, 1, 2 mg  Assessment:  56 yo F s/p bilateral TKA 10/23.  Coumadin started 10/24 for VTE prophylaxis with plans for epidural removal 10/24 and Lovenox to start 12 hours s/p epidural removal.   Lovenox started 10/25 PM but was discontinued 10/26 d/t high INR.  INR up sharply and supratherapeutic 10/26 after warfarin 5 mg daily x 2 doses.  INR therapeutic 10/27 after holding warfarin.   INR subtherapeutic today.    No bleeding reported.  Plans for discharge to SNF noted.  Pharmacy provided Coumadin education 10/25.  Goal of Therapy:  INR 2-3    Recommend:   Warfarin 2.5 mg po daily at 6pm.  Recommend recheck INR tomorrow, then 3 times per week at [facility].  Needs close monitoring and titration of warfarin until INR therapeutic and stable.  Elie Goody, PharmD, BCPS Pager: [phone] 04/05/2012  9:46 AM

## 2012-04-05 NOTE — Progress Notes (Signed)
   Subjective: 6 Days Post-Op Procedure(s) (LRB): TOTAL KNEE BILATERAL (Bilateral) Patient reports pain as mild.   Patient seen in rounds with Dr. Lequita Halt.  Patient was not able to over to the SNF yesterday due to insurance reasons.  Hopefully will get insurance approval today and be able to go over today to the SNF Patient is well, and has had no acute complaints or problems Patient is ready to go to the SNF pending insurance reasons.  Objective: Vital signs in last 24 hours: Temp:  [97.9 F (36.6 C)-98.5 F (36.9 C)] 97.9 F (36.6 C) (10/29 0627) Pulse Rate:  [107-114] 109  (10/29 0627) Resp:  [16] 16  (10/29 0627) BP: (109-129)/(70-79) 120/70 mmHg (10/29 0627) SpO2:  [99 %-100 %] 100 % (10/29 0627)  Intake/Output from previous day:  Intake/Output Summary (Last 24 hours) at 04/05/12 0847 Last data filed at 04/05/12 0727  Gross per 24 hour  Intake    980 ml  Output      0 ml  Net    980 ml    Intake/Output this shift: Total I/O In: 240 [P.O.:240] Out: -   Labs: No results found for this basename: HGB:5 in the last 72 hours No results found for this basename: WBC:2,RBC:2,HCT:2,PLT:2 in the last 72 hours No results found for this basename: NA:2,K:2,CL:2,CO2:2,BUN:2,CREATININE:2,GLUCOSE:2,CALCIUM:2 in the last 72 hours  Basename 04/05/12 0430 04/04/12 0435  LABPT -- --  INR 1.44 1.54*    EXAM: General - Patient is Alert, Appropriate and Oriented Extremity - Neurovascular intact Sensation intact distally Dorsiflexion/Plantar flexion intact No cellulitis present Incision - clean, dry, no drainage, healing to both legs Motor Function - intact, moving foot and toes well on exam.   Assessment/Plan: 6 Days Post-Op Procedure(s) (LRB): TOTAL KNEE BILATERAL (Bilateral) Procedure(s) (LRB): TOTAL KNEE BILATERAL (Bilateral) Past Medical History  Diagnosis Date  . Diabetes mellitus without complication 03-24-12    NIDDM x 15 yrs-oral meds  . H/O hiatal hernia   . GERD  (gastroesophageal reflux disease) 03-24-12    OTC meds used as needed  . Hemorrhoids 03-24-12    not presently a problem  . Arthritis 03-24-12    osteoarthritis. Rt. knee cap shattered. Left femur and tibia fx-no hardware.  . No pertinent past medical history 03-24-12    "Flu Shot and Whooping Cough,Tetanus" done 03-23-12   Principal Problem:  *OA (osteoarthritis) of knee Active Problems:  Postop Acute blood loss anemia  Postop Hyponatremia  Estimated Body mass index is 31.66 kg/(m^2) as calculated from the following:   Height as of this encounter: 5\' 0" (1.524 m).   Weight as of this encounter: 162 lb 2 oz(73.539 kg). Discharge to SNF Diet - Diabetic diet Follow up - in 2 weeks Activity - WBAT to both legs Disposition - Skilled nursing facility Condition Upon Discharge - Good D/C Meds - See DC Summary DVT Prophylaxis - Coumadin  See [place] 04/05/2012, 8:47 AM

## 2012-04-05 NOTE — Discharge Summary (Signed)
Physician Discharge Summary   Patient ID: Cassandra Ortega MRN: [id number] DOB/AGE: 1956/02/10 56 y.o.  Admit date: 03/30/2012 Discharge date: 04/05/2012  Primary Diagnosis: Osteoarthritis Bilateral knee(s)   Admission Diagnoses:  Past Medical History  Diagnosis Date  . Diabetes mellitus without complication 03-24-12    NIDDM x 15 yrs-oral meds  . H/O hiatal hernia   . GERD (gastroesophageal reflux disease) 03-24-12    OTC meds used as needed  . Hemorrhoids 03-24-12    not presently a problem  . Arthritis 03-24-12    osteoarthritis. Rt. knee cap shattered. Left femur and tibia fx-no hardware.  . No pertinent past medical history 03-24-12    "Flu Shot and Whooping Cough,Tetanus" done 03-23-12   Discharge Diagnoses:   Principal Problem:  *OA (osteoarthritis) of knee Active Problems:  Postop Acute blood loss anemia  Postop Hyponatremia  Estimated Body mass index is 31.66 kg/(m^2) as calculated from the following:   Height as of this encounter: 5\' 0" (1.524 m).   Weight as of this encounter: 162 lb 2 oz(73.539 kg).  Classification of overweight in adults according to BMI (WHO, 1998)   Procedure:  Procedure(s) (LRB): TOTAL KNEE BILATERAL (Bilateral)   Consults: [facility] and Anesthesia for epidural management   HPI: Cassandra Ortega is a 56 y.o. 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 She has constant pain, with activity and at rest and significant functional deficits with difficulties even with ADLs. She has had extensive non-op management including analgesics, injections of cortisone and home exercise program, but remains in significant pain with significant dysfunction. Radiographs show bone on bone arthritis medial and patellofemoral on left and bone on bone medial on right. She is s/p patellectomy on the right. She was given the option of staged vs. Simultaneous Total knee arthroplasty and chose to do  both in one setting after discussing the procedure, risks potential complications and rehab course associated with both options.She presents now for bilateralTotal Knee Arthroplasty.   Laboratory Data: Admission on 03/30/2012  Component Date Value Range Status  . ABO/RH(D) 03/30/2012 B POS   Final  . Antibody Screen 03/30/2012 NEG   Final  . Sample Expiration 03/30/2012 04/02/2012   Final  . Glucose-Capillary 03/30/2012 153* 70 - 99 mg/dL Final  . Comment 1 40/98/1191 Documented in Chart   Final  . ABO/RH(D) 03/30/2012 B POS   Final  . Glucose-Capillary 03/30/2012 184* 70 - 99 mg/dL Final  . Comment 1 47/82/9562 Documented in Chart   Final  . Comment 2 03/30/2012 Notify RN   Final  . WBC 03/31/2012 10.1  4.0 - 10.5 K/uL Final  . RBC 03/31/2012 4.02  3.87 - 5.11 MIL/uL Final  . Hemoglobin 03/31/2012 11.4* 12.0 - 15.0 g/dL Final  . HCT 13/01/6577 32.0* 36.0 - 46.0 % Final  . MCV 03/31/2012 79.6  78.0 - 100.0 fL Final  . MCH 03/31/2012 28.4  26.0 - 34.0 pg Final  . MCHC 03/31/2012 35.6  30.0 - 36.0 g/dL Final  . RDW 46/96/2952 12.2  11.5 - 15.5 % Final  . Platelets 03/31/2012 258  150 - 400 K/uL Final  . Sodium 03/31/2012 133* 135 - 145 mEq/L Final  . Potassium 03/31/2012 4.4  3.5 - 5.1 mEq/L Final  . Chloride 03/31/2012 98  96 - 112 mEq/L Final  . CO2 03/31/2012 22  19 - 32 mEq/L Final  . Glucose, Bld 03/31/2012 316* 70 - 99 mg/dL Final  .  BUN 03/31/2012 11  6 - 23 mg/dL Final  . Creatinine, Ser 03/31/2012 0.58  0.50 - 1.10 mg/dL Final  . Calcium 40/98/1191 8.6  8.4 - 10.5 mg/dL Final  . GFR calc non Af Amer 03/31/2012 >90  >90 mL/min Final  . GFR calc Af Amer 03/31/2012 >90  >90 mL/min Final   Comment:                                 The eGFR has been calculated                          using the CKD EPI equation.                          This calculation has not been                          validated in all clinical                          situations.                           eGFR's persistently                          <90 mL/min signify                          possible Chronic Kidney Disease.  Marland Kitchen Prothrombin Time 03/31/2012 14.5  11.6 - 15.2 seconds Final  . INR 03/31/2012 1.15  0.00 - 1.49 Final  . Glucose-Capillary 03/30/2012 317* 70 - 99 mg/dL Final  . Glucose-Capillary 03/31/2012 250* 70 - 99 mg/dL Final  . Glucose-Capillary 03/31/2012 228* 70 - 99 mg/dL Final  . WBC 47/82/9562 8.6  4.0 - 10.5 K/uL Final  . RBC 04/01/2012 3.36* 3.87 - 5.11 MIL/uL Final  . Hemoglobin 04/01/2012 9.7* 12.0 - 15.0 g/dL Final  . HCT 13/01/6577 27.4* 36.0 - 46.0 % Final  . MCV 04/01/2012 81.5  78.0 - 100.0 fL Final  . MCH 04/01/2012 28.9  26.0 - 34.0 pg Final  . MCHC 04/01/2012 35.4  30.0 - 36.0 g/dL Final  . RDW 46/96/2952 13.0  11.5 - 15.5 % Final  . Platelets 04/01/2012 270  150 - 400 K/uL Final  . Sodium 04/01/2012 133* 135 - 145 mEq/L Final  . Potassium 04/01/2012 3.8  3.5 - 5.1 mEq/L Final  . Chloride 04/01/2012 99  96 - 112 mEq/L Final  . CO2 04/01/2012 24  19 - 32 mEq/L Final  . Glucose, Bld 04/01/2012 254* 70 - 99 mg/dL Final  . BUN 84/13/2440 7  6 - 23 mg/dL Final  . Creatinine, Ser 04/01/2012 0.63  0.50 - 1.10 mg/dL Final  . Calcium 04/04/2535 8.7  8.4 - 10.5 mg/dL Final  . GFR calc non Af Amer 04/01/2012 >90  >90 mL/min Final  . GFR calc Af Amer 04/01/2012 >90  >90 mL/min Final   Comment:                                 The eGFR has been calculated  using the CKD EPI equation.                          This calculation has not been                          validated in all clinical                          situations.                          eGFR's persistently                          <90 mL/min signify                          possible Chronic Kidney Disease.  Marland Kitchen Prothrombin Time 04/01/2012 15.9* 11.6 - 15.2 seconds Final  . INR 04/01/2012 1.30  0.00 - 1.49 Final  . Glucose-Capillary 03/31/2012 189* 70 - 99 mg/dL Final  .  Glucose-Capillary 03/31/2012 228* 70 - 99 mg/dL Final  . Glucose-Capillary 04/01/2012 214* 70 - 99 mg/dL Final  . Glucose-Capillary 04/01/2012 179* 70 - 99 mg/dL Final  . Glucose-Capillary 04/01/2012 205* 70 - 99 mg/dL Final  . WBC 16/03/9603 9.5  4.0 - 10.5 K/uL Final  . RBC 04/02/2012 3.23* 3.87 - 5.11 MIL/uL Final  . Hemoglobin 04/02/2012 9.3* 12.0 - 15.0 g/dL Final  . HCT 54/02/8118 26.4* 36.0 - 46.0 % Final  . MCV 04/02/2012 81.7  78.0 - 100.0 fL Final  . MCH 04/02/2012 28.8  26.0 - 34.0 pg Final  . MCHC 04/02/2012 35.2  30.0 - 36.0 g/dL Final  . RDW 14/78/2956 12.9  11.5 - 15.5 % Final  . Platelets 04/02/2012 237  150 - 400 K/uL Final  . Prothrombin Time 04/02/2012 33.6* 11.6 - 15.2 seconds Final  . INR 04/02/2012 3.57* 0.00 - 1.49 Final  . Sodium 04/02/2012 137  135 - 145 mEq/L Final  . Potassium 04/02/2012 3.5  3.5 - 5.1 mEq/L Final  . Chloride 04/02/2012 101  96 - 112 mEq/L Final  . CO2 04/02/2012 27  19 - 32 mEq/L Final  . Glucose, Bld 04/02/2012 221* 70 - 99 mg/dL Final  . BUN 21/30/8657 4* 6 - 23 mg/dL Final  . Creatinine, Ser 04/02/2012 0.51  0.50 - 1.10 mg/dL Final  . Calcium 84/69/6295 8.9  8.4 - 10.5 mg/dL Final  . GFR calc non Af Amer 04/02/2012 >90  >90 mL/min Final  . GFR calc Af Amer 04/02/2012 >90  >90 mL/min Final   Comment:                                 The eGFR has been calculated                          using the CKD EPI equation.                          This calculation has not been  validated in all clinical                          situations.                          eGFR's persistently                          <90 mL/min signify                          possible Chronic Kidney Disease.  . Glucose-Capillary 04/01/2012 183* 70 - 99 mg/dL Final  . Comment 1 16/03/9603 Notify RN   Final  . Glucose-Capillary 04/02/2012 184* 70 - 99 mg/dL Final  . Glucose-Capillary 04/02/2012 175* 70 - 99 mg/dL Final  . Prothrombin Time  04/03/2012 23.5* 11.6 - 15.2 seconds Final  . INR 04/03/2012 2.20* 0.00 - 1.49 Final  . Glucose-Capillary 04/02/2012 172* 70 - 99 mg/dL Final  . Glucose-Capillary 04/02/2012 149* 70 - 99 mg/dL Final  . Comment 1 54/02/8118 Notify RN   Final  . Comment 2 04/02/2012 Documented in Chart   Final  . Glucose-Capillary 04/03/2012 181* 70 - 99 mg/dL Final  . Glucose-Capillary 04/03/2012 175* 70 - 99 mg/dL Final  . Prothrombin Time 04/04/2012 18.0* 11.6 - 15.2 seconds Final  . INR 04/04/2012 1.54* 0.00 - 1.49 Final  . Glucose-Capillary 04/03/2012 231* 70 - 99 mg/dL Final  . Comment 1 14/78/2956 Documented in Chart   Final  . Comment 2 04/03/2012 Notify RN   Final  . Glucose-Capillary 04/03/2012 191* 70 - 99 mg/dL Final  . Comment 1 21/30/8657 Documented in Chart   Final  . Comment 2 04/03/2012 Notify RN   Final  . Glucose-Capillary 04/04/2012 223* 70 - 99 mg/dL Final  . Comment 1 84/69/6295 Notify RN   Final  . Comment 2 04/04/2012 Documented in Chart   Final  . Glucose-Capillary 04/04/2012 198* 70 - 99 mg/dL Final  . Comment 1 28/41/3244 Notify RN   Final  . Comment 2 04/04/2012 Documented in Chart   Final  . Prothrombin Time 04/05/2012 17.2* 11.6 - 15.2 seconds Final  . INR 04/05/2012 1.44  0.00 - 1.49 Final  . Glucose-Capillary 04/04/2012 188* 70 - 99 mg/dL Final  . Comment 1 06/10/7251 Notify RN   Final  . Comment 2 04/04/2012 Documented in Chart   Final  . Glucose-Capillary 04/04/2012 222* 70 - 99 mg/dL Final  . Glucose-Capillary 04/05/2012 206* 70 - 99 mg/dL Final  Hospital Outpatient Visit on 03/24/2012  Component Date Value Range Status  . MRSA, PCR 03/24/2012 NEGATIVE  NEGATIVE Final  . Staphylococcus aureus 03/24/2012 NEGATIVE  NEGATIVE Final   Comment:                                 The Xpert SA Assay (FDA                          approved for NASAL specimens                          in patients over 25 years of age),  is one component of                           a comprehensive surveillance                          program.  Test performance has                          been validated by [facility] for patients greater                          than or equal to 53 year old.                          It is not intended                          to diagnose infection nor to                          guide or monitor treatment.  Marland Kitchen aPTT 03/24/2012 31  24 - 37 seconds Final  . WBC 03/24/2012 12.4* 4.0 - 10.5 K/uL Final  . RBC 03/24/2012 4.92  3.87 - 5.11 MIL/uL Final  . Hemoglobin 03/24/2012 14.1  12.0 - 15.0 g/dL Final  . HCT 40/98/1191 39.6  36.0 - 46.0 % Final  . MCV 03/24/2012 80.5  78.0 - 100.0 fL Final  . MCH 03/24/2012 28.7  26.0 - 34.0 pg Final  . MCHC 03/24/2012 35.6  30.0 - 36.0 g/dL Final  . RDW 47/82/9562 12.3  11.5 - 15.5 % Final  . Platelets 03/24/2012 219  150 - 400 K/uL Final  . Sodium 03/24/2012 133* 135 - 145 mEq/L Final  . Potassium 03/24/2012 4.0  3.5 - 5.1 mEq/L Final  . Chloride 03/24/2012 96  96 - 112 mEq/L Final  . CO2 03/24/2012 26  19 - 32 mEq/L Final  . Glucose, Bld 03/24/2012 245* 70 - 99 mg/dL Final  . BUN 13/01/6577 9  6 - 23 mg/dL Final  . Creatinine, Ser 03/24/2012 0.64  0.50 - 1.10 mg/dL Final  . Calcium 46/96/2952 10.1  8.4 - 10.5 mg/dL Final  . Total Protein 03/24/2012 7.1  6.0 - 8.3 g/dL Final  . Albumin 84/13/2440 3.9  3.5 - 5.2 g/dL Final  . AST 04/04/2535 14  0 - 37 U/L Final  . ALT 03/24/2012 14  0 - 35 U/L Final  . Alkaline Phosphatase 03/24/2012 84  39 - 117 U/L Final  . Total Bilirubin 03/24/2012 0.6  0.3 - 1.2 mg/dL Final  . GFR calc non Af Amer 03/24/2012 >90  >90 mL/min Final  . GFR calc Af Amer 03/24/2012 >90  >90 mL/min Final   Comment:                                 The eGFR has been calculated                          using the CKD  EPI equation.                          This calculation has not been                          validated in all clinical                           situations.                          eGFR's persistently                          <90 mL/min signify                          possible Chronic Kidney Disease.  Marland Kitchen Prothrombin Time 03/24/2012 13.1  11.6 - 15.2 seconds Final  . INR 03/24/2012 1.00  0.00 - 1.49 Final  . Color, Urine 03/24/2012 YELLOW  YELLOW Final  . APPearance 03/24/2012 CLEAR  CLEAR Final  . Specific Gravity, Urine 03/24/2012 1.026  1.005 - 1.030 Final  . pH 03/24/2012 8.0  5.0 - 8.0 Final  . Glucose, UA 03/24/2012 100* NEGATIVE mg/dL Final  . Hgb urine dipstick 03/24/2012 NEGATIVE  NEGATIVE Final  . Bilirubin Urine 03/24/2012 NEGATIVE  NEGATIVE Final  . Ketones, ur 03/24/2012 15* NEGATIVE mg/dL Final  . Protein, ur 40/98/1191 NEGATIVE  NEGATIVE mg/dL Final  . Urobilinogen, UA 03/24/2012 1.0  0.0 - 1.0 mg/dL Final  . Nitrite 47/82/9562 NEGATIVE  NEGATIVE Final  . Leukocytes, UA 03/24/2012 NEGATIVE  NEGATIVE Final   MICROSCOPIC NOT DONE ON URINES WITH NEGATIVE PROTEIN, BLOOD, LEUKOCYTES, NITRITE, OR GLUCOSE <1000 mg/dL.     X-Rays:No results found.  EKG: Orders placed during the hospital encounter of 03/30/12  . EKG     Hospital Course: Patient was admitted to [facility] and taken to the OR and underwent the above stated procedure well without complications. Patient tolerated the procedure well and was later transferred to the recovery room and then to the orthopaedic floor for postoperative care. Anesthesia was consulted postoperatively to place an epidural in for postoperative pain management. The patient was also given PO and IV analgesics for pain control following their surgery. They were given 24 hours of postoperative antibiotics and started on DVT prophylaxis in the form of Lovenox and Coumadin after the epidural had been removed. PT and OT were ordered for total joint protocol. Discharge planning consulted to help with postop disposition and equipment needs. Patient had a good night on the  evening of surgery except for some nausea but started to get up OOB with therapy on day one. PCA Morphine was continued for the first couple of days until the epidural was removed. Hemovac drains were pulled without difficulty on day one. [facility] Inpatient Rehab consult was ordered to see if patient would benefit from CIR. Continued to work with therapy into day two. Dressings were changed on day two and both incisions were healing well. She had an increase in her pain thru the night. Changed her meds for better control and the fact that she continued to have some mild nausea. The epidural was removed with difficulty by Anesthesia on day two. Continue PCA for another day. Anticipated possible increase in  pain temporarily after the epidural had been removed. Switch to PCA Dilaudid for better control. Gave another round of IV Ofirmev due to her nausea and increase in pain. By day three, pain was much better than the day before. Nausea had resolved.  The patient started to show progress with therapy getting up and walking over 60 feet. They continued to receive therapy each day for continued total knee protocol. The incisions were healing well. She decided against Inpatient Rehab at [facility] and wanted to go to one of the SNF. Social work had been involved following the surgery just in case CIR was not an option. They continued to progress on day four and day five at which time the patient was seen in rounds by Dr. Lequita Halt and was ready to go the SNF.  Addendum - She did not get insurance approval yesterday 04/04/12.  Awaiting approval today.  If insurance approved, will go over today to SNF.  Discharge Medications: Prior to Admission medications   Medication Sig Start Date End Date Taking? Authorizing Provider  acetaminophen (TYLENOL) 500 MG tablet Take 500 mg by mouth every 6 (six) hours as needed. For pain   Yes Historical Provider, MD  buPROPion (WELLBUTRIN SR) 150 MG 12 hr tablet Take 150 mg by mouth every  morning.   Yes Historical Provider, MD  metFORMIN (GLUCOPHAGE) 500 MG tablet Take 500 mg by mouth 2 (two) times daily with a meal.   Yes Historical Provider, MD  bisacodyl (DULCOLAX) 10 MG suppository Place 1 suppository (10 mg total) rectally daily as needed. 04/04/12   Alexzandrew Julien Girt, PA  cyclobenzaprine (FLEXERIL) 10 MG tablet Take 1 tablet (10 mg total) by mouth 3 (three) times daily as needed for muscle spasms. 04/04/12   Alexzandrew Perkins, PA  docusate sodium 100 MG CAPS Take 100 mg by mouth 2 (two) times daily. 04/04/12   Alexzandrew Perkins, PA  HYDROmorphone (DILAUDID) 2 MG tablet Take 1-2 tablets (2-4 mg total) by mouth every 4 (four) hours as needed. 04/04/12   Alexzandrew Julien Girt, PA  iron polysaccharides (NIFEREX) 150 MG capsule Take 1 capsule (150 mg total) by mouth daily. 04/04/12   Alexzandrew Julien Girt, PA  metoCLOPramide (REGLAN) 5 MG tablet Take 1 tablet (5 mg total) by mouth every 8 (eight) hours as needed (if ondansetron (ZOFRAN) ineffective.). 04/04/12   Alexzandrew Julien Girt, PA  warfarin (COUMADIN) 1 MG tablet Take 1 tablet (1 mg total) by mouth one time only at 6 PM. Take Coumadin for four weeks and then discontinue.  The dose may need to be adjusted based upon the INR.  Please follow the INR and titrate Coumadin dose for a therapeutic range between 2.0 and 3.0 INR.  After completing the three weeks of Coumadin, the patient may stop the Coumadin and then take  81 mg Aspirin daily for four more weeks. 04/04/12   Alexzandrew Perkins, PA  LOVENOX 30 mg Sub-Q injection Q 12 hours until INR is therapeutic on the Coumadin. Continue Lovenox injections until the INR is therapeutic at or greater than 2.0.  When INR reaches the therapeutic level of equal to or greater than 2.0, the patient may discontinue the Lovenox injections.  Diet: Diabetic diet  Activity:WBAT  Follow-up:in 2 weeks  Disposition - Skilled nursing facility - [facility]  Discharged Condition:  good    Discharge Orders    Future Orders Please Complete By Expires   Diet Carb Modified      Call MD / Call 911  Comments:   If you experience chest pain or shortness of breath, CALL 911 and be transported to the hospital emergency room.  If you develope a fever above 101 F, pus (white drainage) or increased drainage or redness at the wound, or calf pain, call your surgeon's office.   Discharge instructions      Comments:   Pick up stool softner and laxative for home. Do not submerge incision under water. May shower. Continue to use ice for pain and swelling from surgery.  Take Coumadin for four weeks and then discontinue.  The dose may need to be adjusted based upon the INR.  Please follow the INR and titrate Coumadin dose for a therapeutic range between 2.0 and 3.0 INR.  After completing the four weeks of Coumadin, the patient may stop the Coumadin and then take 81 mg Aspirin daily for four more weeks.  Continue Lovenox injections until the INR is therapeutic at or greater than 2.0.  When INR reaches the therapeutic level of equal to or greater than 2.0, the patient may discontinue the Lovenox injections.  When discharged from the skilled rehab facility, please have the facility set up the patient's Home Health Physical Therapy prior to being released.  Also provide the patient with their medications at time of release from the facility to include their pain medication, the muscle relaxants, and their blood thinner medication.  If the patient is still at the rehab facility at time of follow up appointment, please also assist the patient in arranging follow up appointment in our office and any transportation needs.   Constipation Prevention      Comments:   Drink plenty of fluids.  Prune juice may be helpful.  You may use a stool softener, such as Colace (over the counter) 100 mg twice a day.  Use MiraLax (over the counter) for constipation as needed.   Increase activity slowly as  tolerated      Patient may shower      Comments:   You may shower without a dressing once there is no drainage.  Do not wash over the wound.  If drainage remains, do not shower until drainage stops.   Weight bearing as tolerated      Driving restrictions      Comments:   No driving until released by the physician.   Lifting restrictions      Comments:   No lifting until released by the physician.   TED hose      Comments:   Use stockings (TED hose) for 3 weeks on both leg(s).  You may remove them at night for sleeping.   Change dressing      Comments:   Change dressing daily with sterile 4 x 4 inch gauze dressing and apply TED hose. Do not submerge the incision under water.   Do not put a pillow under the knee. Place it under the heel.      Do not sit on low chairs, stoools or toilet seats, as it may be difficult to get up from low surfaces          Medication List     As of 04/05/2012 10:48 AM    STOP taking these medications         estradiol 25 MCG vaginal tablet   Commonly known as: VAGIFEM      TAKE these medications         acetaminophen 500 MG tablet   Commonly known as: TYLENOL  Take 500 mg by mouth every 6 (six) hours as needed. For pain      bisacodyl 10 MG suppository   Commonly known as: DULCOLAX   Place 1 suppository (10 mg total) rectally daily as needed.      buPROPion 150 MG 12 hr tablet   Commonly known as: WELLBUTRIN SR   Take 150 mg by mouth every morning.      cyclobenzaprine 10 MG tablet   Commonly known as: FLEXERIL   Take 1 tablet (10 mg total) by mouth 3 (three) times daily as needed for muscle spasms.      DSS 100 MG Caps   Take 100 mg by mouth 2 (two) times daily.      HYDROmorphone 2 MG tablet   Commonly known as: DILAUDID   Take 1-2 tablets (2-4 mg total) by mouth every 4 (four) hours as needed.      iron polysaccharides 150 MG capsule   Commonly known as: NIFEREX   Take 1 capsule (150 mg total) by mouth daily.      metFORMIN  500 MG tablet   Commonly known as: GLUCOPHAGE   Take 500 mg by mouth 2 (two) times daily with a meal.      metoCLOPramide 5 MG tablet   Commonly known as: REGLAN   Take 1 tablet (5 mg total) by mouth every 8 (eight) hours as needed (if ondansetron (ZOFRAN) ineffective.).      warfarin 1 MG tablet   Commonly known as: COUMADIN   Take 1 tablet (1 mg total) by mouth one time only at 6 PM. Take Coumadin for four weeks and then discontinue.  The dose may need to be adjusted based upon the INR.  Please follow the INR and titrate Coumadin dose for a therapeutic range between 2.0 and 3.0 INR.  After completing the three weeks of Coumadin, the patient may stop the Coumadin and then take  81 mg Aspirin daily for four more weeks.  LOVENOX 30 mg Sub-Q injection Q 12 hours until INR is therapeutic on the Coumadin. Continue Lovenox injections until the INR is therapeutic at or greater than 2.0.  When INR reaches the therapeutic level of equal to or greater than 2.0, the patient may discontinue the Lovenox injections.            Follow-up Information    Follow up with Loanne Drilling, MD. Schedule an appointment as soon as possible for a visit in 2 weeks.   Contact information:   [address] [place] [address] [phone]          Signed: Patrica Duel 04/05/2012, 10:48 AM

## 2012-04-05 NOTE — Progress Notes (Signed)
[employer] approval received. MD/pt updated. [facility] contacted for transport to [facility].  Cori Razor LCSW [phone]

## 2012-04-06 NOTE — Progress Notes (Signed)
Clinical Social Work Department CLINICAL SOCIAL WORK PLACEMENT NOTE 04/06/2012  Patient:  Cassandra Ortega, Cassandra Ortega  Account Number:  [id number] Admit date:  03/30/2012  Clinical Social Worker:  Cori Razor, LCSW  Date/time:  03/31/2012 04:11 PM  Clinical Social Work is seeking post-discharge placement for this patient at the following level of care:   SKILLED NURSING   (*CSW will update this form in Epic as items are completed)     Patient/family provided with [facility] Department of Clinical Social Work's list of facilities offering this level of care within the geographic area requested by the patient (or if unable, by the patient's family).    Patient/family informed of their freedom to choose among providers that offer the needed level of care, that participate in Medicare, Medicaid or managed care program needed by the patient, have an available bed and are willing to accept the patient.    Patient/family informed of [facility]' ownership interest in [facility], as well as of the fact that they are under no obligation to receive care at this facility.  PASARR submitted to [employer] on  PASARR number received from [employer] on   FL2 transmitted to all facilities in geographic area requested by pt/family on  03/31/2012 FL2 transmitted to all facilities within larger geographic area on   Patient informed that his/her managed care company has contracts with or will negotiate with  certain facilities, including the following:     Patient/family informed of bed offers received:  04/01/2012 Patient chooses bed at [facility] Physician recommends and patient chooses bed at    Patient to be transferred to [facility] on  04/05/2012 Patient to be transferred to facility by [facility]  The following physician request were entered in Epic:   Additional Comments:  Cori Razor LCSW [phone]

## 2014-08-07 ENCOUNTER — Encounter: Payer: Managed Care, Other (non HMO) | Attending: Family Medicine

## 2014-08-07 VITALS — Ht 60.0 in | Wt 153.5 lb

## 2014-08-07 DIAGNOSIS — E119 Type 2 diabetes mellitus without complications: Secondary | ICD-10-CM | POA: Insufficient documentation

## 2014-08-07 DIAGNOSIS — Z713 Dietary counseling and surveillance: Secondary | ICD-10-CM | POA: Insufficient documentation

## 2014-08-07 NOTE — Progress Notes (Signed)

## 2014-08-14 DIAGNOSIS — E119 Type 2 diabetes mellitus without complications: Secondary | ICD-10-CM

## 2014-08-14 NOTE — Progress Notes (Signed)

## 2014-08-21 DIAGNOSIS — E119 Type 2 diabetes mellitus without complications: Secondary | ICD-10-CM

## 2014-08-22 NOTE — Progress Notes (Signed)
Patient was seen on 08/21/14 for the third of a series of three diabetes self-management courses at the [facility]. The following learning objectives were met by the patient during this class:  . State the amount of activity recommended for healthy living . Describe activities suitable for individual needs . Identify ways to regularly incorporate activity into daily life . Identify barriers to activity and ways to over come these barriers  Identify diabetes medications being personally used and their primary action for lowering glucose and possible side effects . Describe role of stress on blood glucose and develop strategies to address psychosocial issues . Identify diabetes complications and ways to prevent them  Explain how to manage diabetes during illness . Evaluate success in meeting personal goal . Establish 2-3 goals that they will plan to diligently work on until they return for the  74-monthfollow-up visit  Goals:   I will count my carb choices at most meals and snacks  I will be active 45 minutes or more 3-5 times a week  I will take my diabetes medications as scheduled  I will eat less unhealthy fats by eating less cookies and cake  I will test my glucose at least 2 times a day, 7 days a week  I will look at patterns in my record book at least 7 days a month  Your patient has identified these potential barriers to change:  Finances Stress  Your patient has identified their diabetes self-care support plan as  [facility]  Attend Core 4 in 4 months

## 2014-11-28 ENCOUNTER — Encounter: Payer: Managed Care, Other (non HMO) | Attending: Family Medicine

## 2014-11-28 DIAGNOSIS — E119 Type 2 diabetes mellitus without complications: Secondary | ICD-10-CM | POA: Diagnosis present

## 2014-11-28 DIAGNOSIS — Z6829 Body mass index (BMI) 29.0-29.9, adult: Secondary | ICD-10-CM | POA: Diagnosis not present

## 2014-11-28 DIAGNOSIS — Z713 Dietary counseling and surveillance: Secondary | ICD-10-CM | POA: Diagnosis not present

## 2014-11-28 NOTE — Progress Notes (Signed)
Appt start time: 0900 end time:  1000.  Patient was seen on 11/28/2014 for a review of the series of three diabetes self-management courses at the [facility]. The following learning objectives were met by the patient during this class:  . Reviewed blood glucose monitoring and interpretation including the recommended target ranges and Hgb A1c.  . Reviewed on carb counting, importance of regularly scheduled meals/snacks, and meal planning.  . Reviewed the effects of physical activity on glucose levels and long-term glucose control.  Recommended goal of 150 minutes of physical activity/week. . Reviewed patient medications and discussed role of medication on blood glucose and possible side effects. . Discussed strategies to manage stress, psychosocial issues, and other obstacles to diabetes management. . Encouraged moderate weight reduction to improve glucose levels.   . Reviewed short-term complications: hyper- and hypo-glycemia.  Discussed causes, symptoms, and treatment options. . Reviewed prevention, detection, and treatment of long-term complications.  Discussed the role of prolonged elevated glucose levels on body systems.  Goals:  Follow Diabetes Meal Plan as instructed  Eat 3 meals and 2 snacks, every 3-5 hrs  Limit carbohydrate intake to 45 grams carbohydrate/meal Limit carbohydrate intake to 15 grams carbohydrate/snack Add lean protein foods to meals/snacks  Monitor glucose levels as instructed by your doctor  Aim for goal of 15-30 mins of physical activity daily as tolerated  Bring food record and glucose log to your next nutrition visit

## 2016-03-13 ENCOUNTER — Encounter (HOSPITAL_COMMUNITY): Payer: Self-pay

## 2017-05-18 ENCOUNTER — Other Ambulatory Visit: Payer: Self-pay | Admitting: Obstetrics and Gynecology

## 2017-05-18 DIAGNOSIS — R928 Other abnormal and inconclusive findings on diagnostic imaging of breast: Secondary | ICD-10-CM

## 2017-06-16 ENCOUNTER — Other Ambulatory Visit: Payer: Self-pay | Admitting: Obstetrics and Gynecology

## 2017-06-16 ENCOUNTER — Ambulatory Visit
Admission: RE | Admit: 2017-06-16 | Discharge: 2017-06-16 | Disposition: A | Discharge status: Home / Self Care | Payer: Managed Care, Other (non HMO) | Source: Ambulatory Visit | Attending: Obstetrics and Gynecology | Admitting: Obstetrics and Gynecology

## 2017-06-16 DIAGNOSIS — R928 Other abnormal and inconclusive findings on diagnostic imaging of breast: Secondary | ICD-10-CM

## 2017-06-16 DIAGNOSIS — R921 Mammographic calcification found on diagnostic imaging of breast: Secondary | ICD-10-CM

## 2017-12-15 ENCOUNTER — Other Ambulatory Visit: Payer: Self-pay | Admitting: Obstetrics and Gynecology

## 2017-12-15 ENCOUNTER — Ambulatory Visit
Admission: RE | Admit: 2017-12-15 | Discharge: 2017-12-15 | Disposition: A | Discharge status: Home / Self Care | Payer: Managed Care, Other (non HMO) | Source: Ambulatory Visit | Attending: Obstetrics and Gynecology | Admitting: Obstetrics and Gynecology

## 2017-12-15 DIAGNOSIS — R921 Mammographic calcification found on diagnostic imaging of breast: Secondary | ICD-10-CM

## 2018-03-21 ENCOUNTER — Encounter (HOSPITAL_COMMUNITY): Payer: Self-pay

## 2018-06-21 ENCOUNTER — Ambulatory Visit
Admission: RE | Admit: 2018-06-21 | Discharge: 2018-06-21 | Disposition: A | Discharge status: Home / Self Care | Payer: Managed Care, Other (non HMO) | Source: Ambulatory Visit | Attending: Obstetrics and Gynecology | Admitting: Obstetrics and Gynecology

## 2018-06-21 DIAGNOSIS — R921 Mammographic calcification found on diagnostic imaging of breast: Secondary | ICD-10-CM

## 2019-05-22 ENCOUNTER — Other Ambulatory Visit: Payer: Self-pay | Admitting: Obstetrics and Gynecology

## 2019-05-22 DIAGNOSIS — R921 Mammographic calcification found on diagnostic imaging of breast: Secondary | ICD-10-CM

## 2019-06-23 ENCOUNTER — Other Ambulatory Visit: Payer: Self-pay

## 2019-06-23 ENCOUNTER — Ambulatory Visit
Admission: RE | Admit: 2019-06-23 | Discharge: 2019-06-23 | Disposition: A | Discharge status: Home / Self Care | Payer: Managed Care, Other (non HMO) | Source: Ambulatory Visit | Attending: Obstetrics and Gynecology | Admitting: Obstetrics and Gynecology

## 2019-06-23 DIAGNOSIS — R921 Mammographic calcification found on diagnostic imaging of breast: Secondary | ICD-10-CM

## 2020-07-08 ENCOUNTER — Other Ambulatory Visit: Payer: Self-pay | Admitting: Obstetrics and Gynecology

## 2020-07-08 DIAGNOSIS — Z0000 Encounter for general adult medical examination without abnormal findings: Secondary | ICD-10-CM

## 2020-08-20 ENCOUNTER — Inpatient Hospital Stay: Admission: RE | Admit: 2020-08-20 | Payer: Managed Care, Other (non HMO) | Source: Ambulatory Visit

## 2020-10-10 ENCOUNTER — Inpatient Hospital Stay: Admission: RE | Admit: 2020-10-10 | Payer: Managed Care, Other (non HMO) | Source: Ambulatory Visit

## 2020-12-11 ENCOUNTER — Other Ambulatory Visit: Payer: Self-pay

## 2020-12-11 ENCOUNTER — Ambulatory Visit
Admission: RE | Admit: 2020-12-11 | Discharge: 2020-12-11 | Disposition: A | Discharge status: Home / Self Care | Payer: Medicare Other | Source: Ambulatory Visit | Attending: Obstetrics and Gynecology | Admitting: Obstetrics and Gynecology

## 2020-12-11 DIAGNOSIS — Z0000 Encounter for general adult medical examination without abnormal findings: Secondary | ICD-10-CM

## 2021-08-19 IMAGING — MG DIGITAL DIAGNOSTIC BILAT W/ TOMO W/ CAD
6 of 10 series · 6 of 26 positions shown · non-contrast
Comparison: Previous exam(s).

CLINICAL DATA: Patient for follow-up of probably benign right
breast calcifications.

EXAM:
DIGITAL DIAGNOSTIC BILATERAL MAMMOGRAM WITH CAD AND TOMO

[R ML]
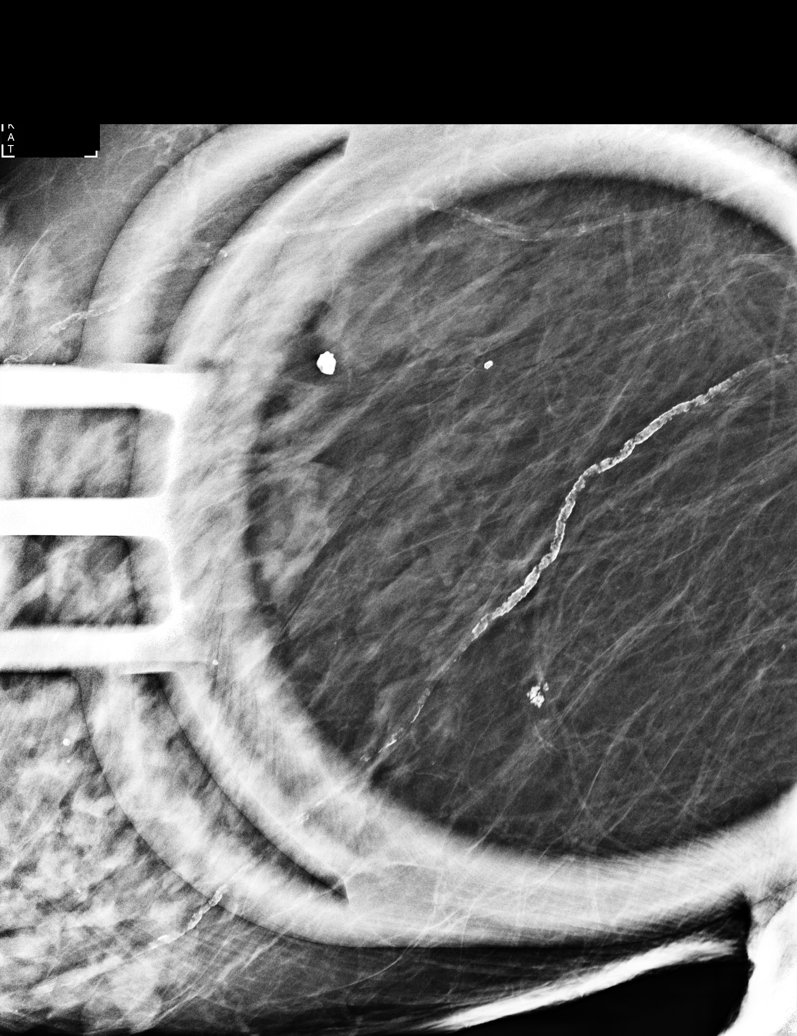

[R CC]
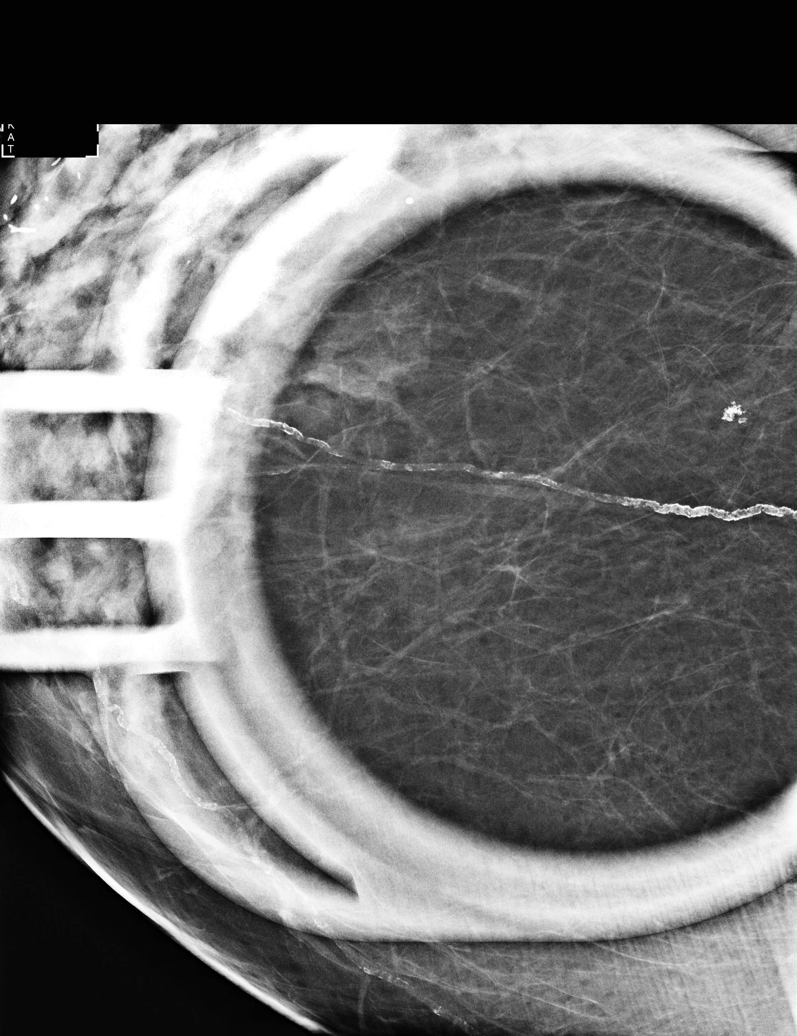

[R MLO synth-2D]
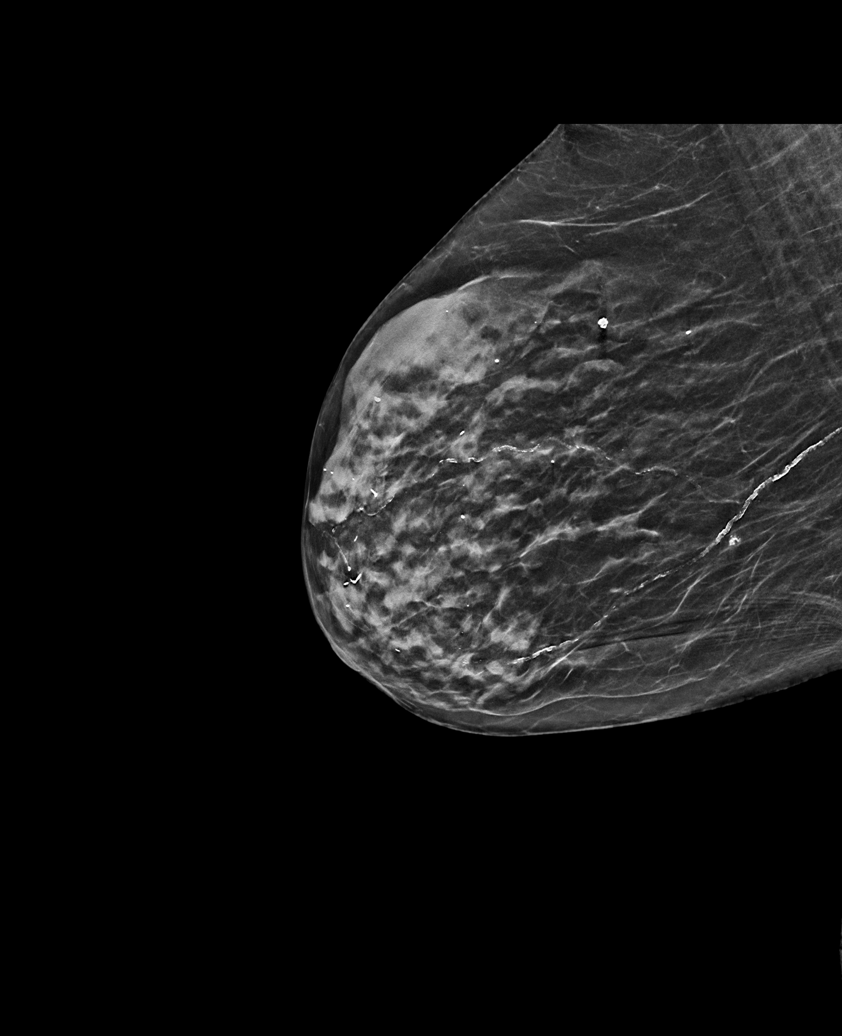

[L MLO synth-2D]
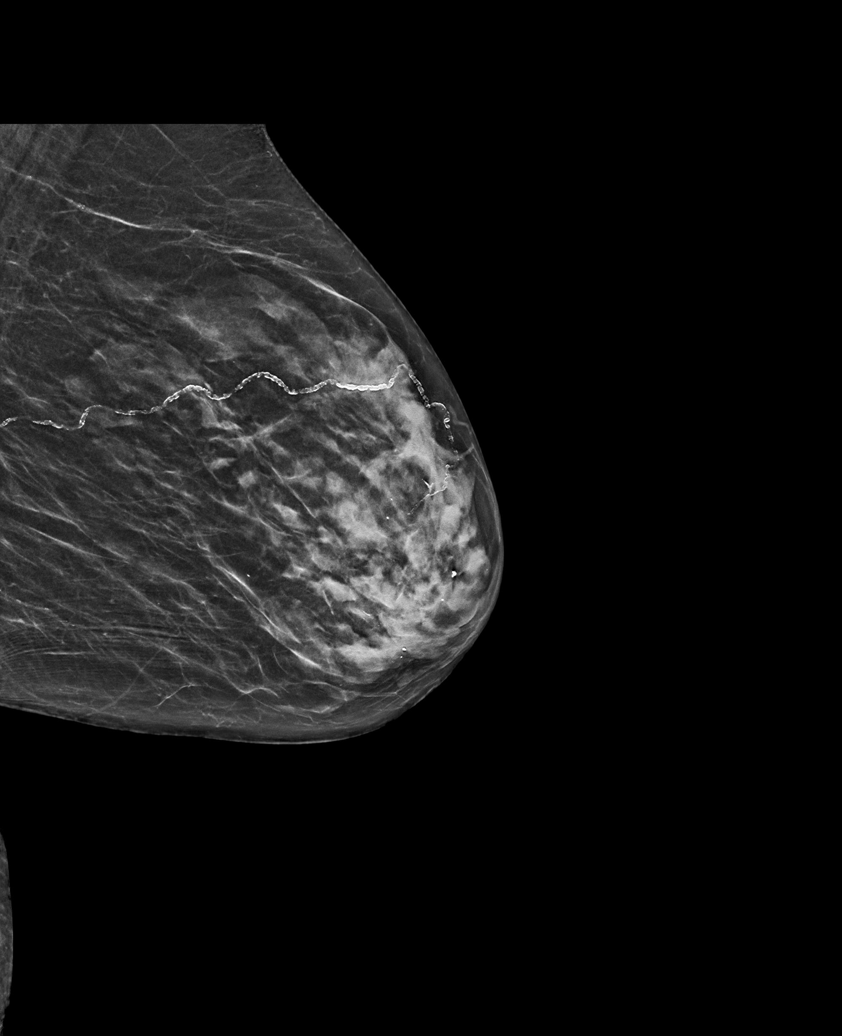

[R CC synth-2D]
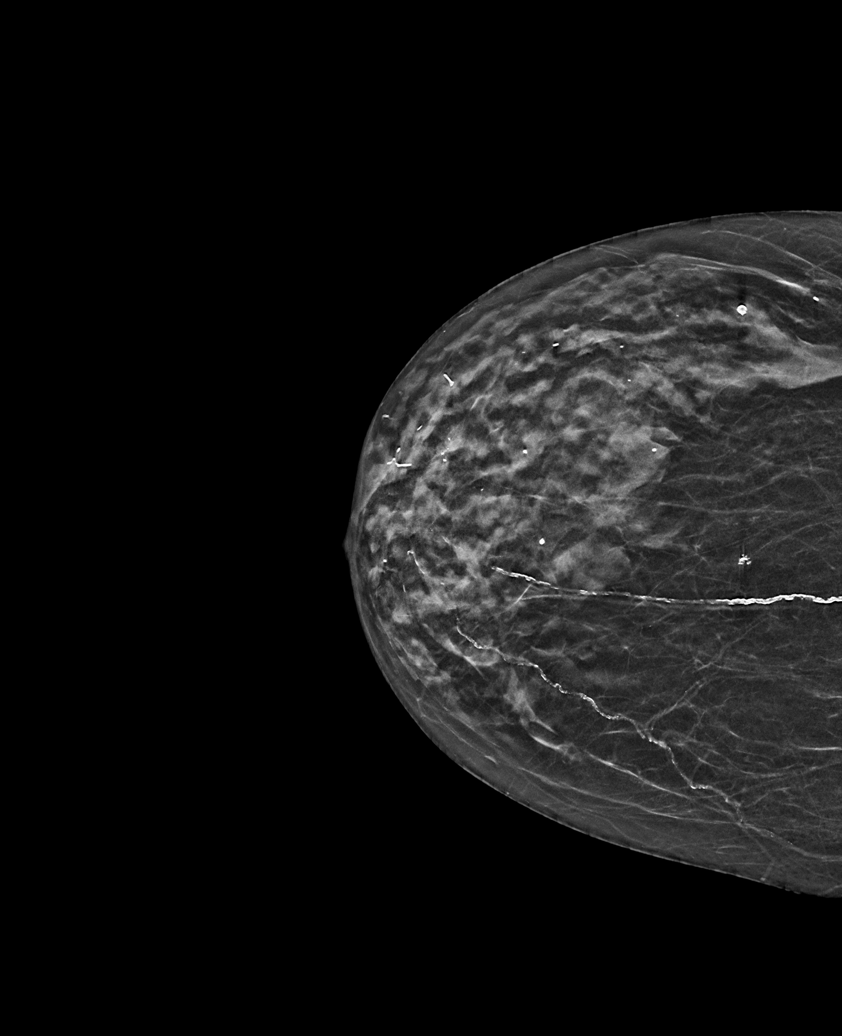

[L CC synth-2D]
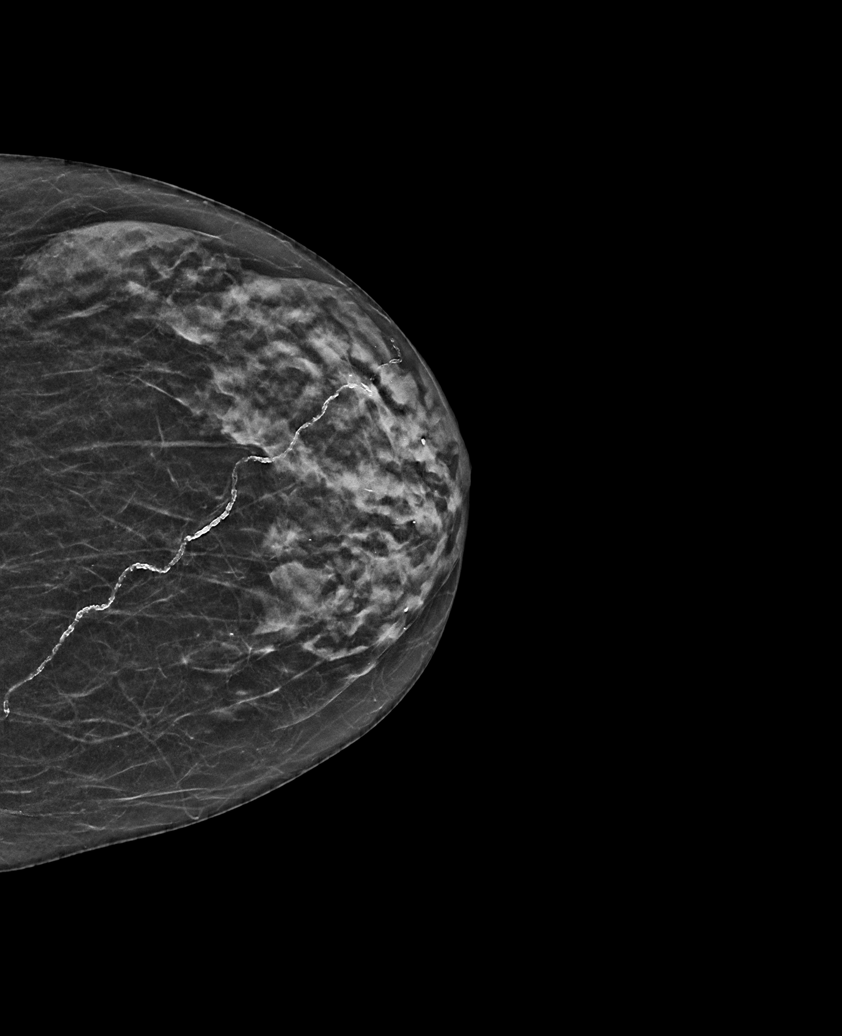

[6 of 26 positions shown; findings below may reference images not displayed]

ACR Breast Density Category c: The breast tissue is heterogeneously
dense, which may obscure small masses.
FINDINGS: Unchanged coarse calcifications within the posterior right breast on
the cc view compatible with benign process given stability over time
and morphologic appearance. No new masses, calcifications or
distortion identified within either breast.

Mammographic images were processed with CAD.
IMPRESSION: No mammographic evidence for malignancy.

RECOMMENDATION:
Screening mammogram in one year.(Code:[id number])

I have discussed the findings and recommendations with the patient.
If applicable, a reminder letter will be sent to the patient
regarding the next appointment.

BI-RADS CATEGORY  2: Benign.

## 2021-11-06 ENCOUNTER — Other Ambulatory Visit: Payer: Self-pay | Admitting: Family Medicine

## 2021-11-06 DIAGNOSIS — Z1231 Encounter for screening mammogram for malignant neoplasm of breast: Secondary | ICD-10-CM

## 2021-12-22 ENCOUNTER — Ambulatory Visit
Admission: RE | Admit: 2021-12-22 | Discharge: 2021-12-22 | Disposition: A | Discharge status: Home / Self Care | Payer: Medicare Other | Source: Ambulatory Visit | Attending: Family Medicine | Admitting: Family Medicine

## 2021-12-22 DIAGNOSIS — Z1231 Encounter for screening mammogram for malignant neoplasm of breast: Secondary | ICD-10-CM
